# Patient Record
Sex: Male | Born: 1945 | Race: Black or African American | Hispanic: No | Marital: Married | Smoking: Former smoker
Health system: Southern US, Community
[De-identification: ages and names within clinical notes are randomized; demographics above are authoritative.]

## PROBLEM LIST (undated history)

## (undated) DIAGNOSIS — E785 Hyperlipidemia, unspecified: Secondary | ICD-10-CM

## (undated) DIAGNOSIS — I10 Essential (primary) hypertension: Secondary | ICD-10-CM

## (undated) DIAGNOSIS — S52372A Galeazzi's fracture of left radius, initial encounter for closed fracture: Secondary | ICD-10-CM

## (undated) DIAGNOSIS — E119 Type 2 diabetes mellitus without complications: Secondary | ICD-10-CM

## (undated) DIAGNOSIS — D649 Anemia, unspecified: Secondary | ICD-10-CM

## (undated) HISTORY — PX: APPENDECTOMY: SHX54

## (undated) HISTORY — DX: Anemia, unspecified: D64.9

## (undated) HISTORY — PX: HEMICOLECTOMY: SHX854

## (undated) HISTORY — DX: Hyperlipidemia, unspecified: E78.5

## (undated) HISTORY — PX: BACK SURGERY: SHX140

---

## 2000-02-22 ENCOUNTER — Emergency Department (HOSPITAL_COMMUNITY): Admission: EM | Admit: 2000-02-22 | Discharge: 2000-02-22 | Payer: Self-pay | Admitting: Emergency Medicine

## 2000-03-10 ENCOUNTER — Encounter: Payer: Self-pay | Admitting: Orthopedic Surgery

## 2000-03-10 ENCOUNTER — Ambulatory Visit (HOSPITAL_COMMUNITY): Admission: RE | Admit: 2000-03-10 | Discharge: 2000-03-10 | Payer: Self-pay | Admitting: Orthopedic Surgery

## 2000-06-30 ENCOUNTER — Inpatient Hospital Stay (HOSPITAL_COMMUNITY): Admission: RE | Admit: 2000-06-30 | Discharge: 2000-07-02 | Payer: Self-pay | Admitting: Neurosurgery

## 2000-07-28 ENCOUNTER — Emergency Department (HOSPITAL_COMMUNITY): Admission: EM | Admit: 2000-07-28 | Discharge: 2000-07-28 | Payer: Self-pay | Admitting: Emergency Medicine

## 2000-08-04 ENCOUNTER — Encounter: Admission: RE | Admit: 2000-08-04 | Discharge: 2000-08-04 | Payer: Self-pay | Admitting: Neurosurgery

## 2000-09-06 ENCOUNTER — Encounter: Admission: RE | Admit: 2000-09-06 | Discharge: 2000-09-21 | Payer: Self-pay | Admitting: Neurosurgery

## 2002-07-23 ENCOUNTER — Inpatient Hospital Stay (HOSPITAL_COMMUNITY): Admission: EM | Admit: 2002-07-23 | Discharge: 2002-07-31 | Payer: Self-pay | Admitting: Family Medicine

## 2002-07-23 ENCOUNTER — Encounter: Payer: Self-pay | Admitting: Family Medicine

## 2002-07-23 ENCOUNTER — Encounter: Payer: Self-pay | Admitting: General Surgery

## 2005-01-26 ENCOUNTER — Ambulatory Visit: Payer: Self-pay | Admitting: Internal Medicine

## 2005-02-05 ENCOUNTER — Ambulatory Visit: Payer: Self-pay | Admitting: Internal Medicine

## 2010-08-14 ENCOUNTER — Emergency Department (HOSPITAL_COMMUNITY)
Admission: EM | Admit: 2010-08-14 | Discharge: 2010-08-14 | Disposition: A | Discharge status: Home / Self Care | Payer: Medicare HMO | Attending: Emergency Medicine | Admitting: Emergency Medicine

## 2010-08-14 DIAGNOSIS — M25539 Pain in unspecified wrist: Secondary | ICD-10-CM | POA: Insufficient documentation

## 2010-08-14 DIAGNOSIS — E7800 Pure hypercholesterolemia, unspecified: Secondary | ICD-10-CM | POA: Insufficient documentation

## 2010-08-14 DIAGNOSIS — I10 Essential (primary) hypertension: Secondary | ICD-10-CM | POA: Insufficient documentation

## 2010-08-14 DIAGNOSIS — M7989 Other specified soft tissue disorders: Secondary | ICD-10-CM | POA: Insufficient documentation

## 2014-03-24 DIAGNOSIS — K660 Peritoneal adhesions (postprocedural) (postinfection): Secondary | ICD-10-CM | POA: Insufficient documentation

## 2014-03-24 DIAGNOSIS — I10 Essential (primary) hypertension: Secondary | ICD-10-CM | POA: Insufficient documentation

## 2014-03-24 DIAGNOSIS — R001 Bradycardia, unspecified: Secondary | ICD-10-CM | POA: Insufficient documentation

## 2014-03-24 DIAGNOSIS — I2510 Atherosclerotic heart disease of native coronary artery without angina pectoris: Secondary | ICD-10-CM | POA: Insufficient documentation

## 2014-03-24 DIAGNOSIS — E119 Type 2 diabetes mellitus without complications: Secondary | ICD-10-CM | POA: Diagnosis not present

## 2014-03-24 DIAGNOSIS — I517 Cardiomegaly: Secondary | ICD-10-CM | POA: Insufficient documentation

## 2014-03-24 DIAGNOSIS — K8044 Calculus of bile duct with chronic cholecystitis without obstruction: Secondary | ICD-10-CM | POA: Diagnosis not present

## 2014-03-24 DIAGNOSIS — K760 Fatty (change of) liver, not elsewhere classified: Secondary | ICD-10-CM | POA: Diagnosis not present

## 2014-03-24 DIAGNOSIS — K5730 Diverticulosis of large intestine without perforation or abscess without bleeding: Secondary | ICD-10-CM | POA: Insufficient documentation

## 2014-03-24 DIAGNOSIS — Z87891 Personal history of nicotine dependence: Secondary | ICD-10-CM | POA: Diagnosis not present

## 2014-03-24 DIAGNOSIS — N281 Cyst of kidney, acquired: Secondary | ICD-10-CM | POA: Diagnosis not present

## 2014-03-24 DIAGNOSIS — R1013 Epigastric pain: Secondary | ICD-10-CM | POA: Diagnosis present

## 2014-03-25 ENCOUNTER — Emergency Department (HOSPITAL_COMMUNITY): Payer: Medicare HMO

## 2014-03-25 ENCOUNTER — Observation Stay (HOSPITAL_COMMUNITY)
Admission: EM | Admit: 2014-03-25 | Discharge: 2014-03-26 | Disposition: A | Discharge status: Home / Self Care | Payer: Medicare HMO | Attending: General Surgery | Admitting: General Surgery

## 2014-03-25 ENCOUNTER — Encounter (HOSPITAL_COMMUNITY): Payer: Self-pay | Admitting: *Deleted

## 2014-03-25 ENCOUNTER — Encounter (HOSPITAL_COMMUNITY)
Admission: EM | Disposition: A | Discharge status: Home / Self Care | Payer: Self-pay | Source: Home / Self Care | Attending: Emergency Medicine

## 2014-03-25 ENCOUNTER — Observation Stay (HOSPITAL_COMMUNITY): Payer: Medicare HMO | Admitting: Anesthesiology

## 2014-03-25 ENCOUNTER — Observation Stay (HOSPITAL_COMMUNITY): Payer: Medicare HMO

## 2014-03-25 DIAGNOSIS — R52 Pain, unspecified: Secondary | ICD-10-CM

## 2014-03-25 DIAGNOSIS — K8044 Calculus of bile duct with chronic cholecystitis without obstruction: Secondary | ICD-10-CM | POA: Diagnosis not present

## 2014-03-25 DIAGNOSIS — K819 Cholecystitis, unspecified: Secondary | ICD-10-CM | POA: Diagnosis present

## 2014-03-25 DIAGNOSIS — K760 Fatty (change of) liver, not elsewhere classified: Secondary | ICD-10-CM | POA: Diagnosis not present

## 2014-03-25 DIAGNOSIS — I10 Essential (primary) hypertension: Secondary | ICD-10-CM | POA: Diagnosis not present

## 2014-03-25 DIAGNOSIS — K8050 Calculus of bile duct without cholangitis or cholecystitis without obstruction: Secondary | ICD-10-CM | POA: Diagnosis present

## 2014-03-25 DIAGNOSIS — K660 Peritoneal adhesions (postprocedural) (postinfection): Secondary | ICD-10-CM | POA: Diagnosis not present

## 2014-03-25 DIAGNOSIS — K8001 Calculus of gallbladder with acute cholecystitis with obstruction: Secondary | ICD-10-CM | POA: Diagnosis present

## 2014-03-25 DIAGNOSIS — K8010 Calculus of gallbladder with chronic cholecystitis without obstruction: Secondary | ICD-10-CM

## 2014-03-25 DIAGNOSIS — R071 Chest pain on breathing: Secondary | ICD-10-CM

## 2014-03-25 DIAGNOSIS — R109 Unspecified abdominal pain: Secondary | ICD-10-CM

## 2014-03-25 HISTORY — DX: Type 2 diabetes mellitus without complications: E11.9

## 2014-03-25 HISTORY — PX: LYSIS OF ADHESION: SHX5961

## 2014-03-25 HISTORY — PX: CHOLECYSTECTOMY: SHX55

## 2014-03-25 HISTORY — DX: Essential (primary) hypertension: I10

## 2014-03-25 LAB — CBC WITH DIFFERENTIAL/PLATELET
BASOS PCT: 0 % (ref 0–1)
Basophils Absolute: 0 10*3/uL (ref 0.0–0.1)
EOS ABS: 0.1 10*3/uL (ref 0.0–0.7)
Eosinophils Relative: 2 % (ref 0–5)
HEMATOCRIT: 40.6 % (ref 39.0–52.0)
HEMOGLOBIN: 13.2 g/dL (ref 13.0–17.0)
Lymphocytes Relative: 22 % (ref 12–46)
Lymphs Abs: 1.8 10*3/uL (ref 0.7–4.0)
MCH: 27.2 pg (ref 26.0–34.0)
MCHC: 32.5 g/dL (ref 30.0–36.0)
MCV: 83.5 fL (ref 78.0–100.0)
MONO ABS: 0.6 10*3/uL (ref 0.1–1.0)
Monocytes Relative: 8 % (ref 3–12)
Neutro Abs: 5.5 10*3/uL (ref 1.7–7.7)
Neutrophils Relative %: 68 % (ref 43–77)
Platelets: 171 10*3/uL (ref 150–400)
RBC: 4.86 MIL/uL (ref 4.22–5.81)
RDW: 13.7 % (ref 11.5–15.5)
WBC: 8 10*3/uL (ref 4.0–10.5)

## 2014-03-25 LAB — COMPREHENSIVE METABOLIC PANEL
ALT: 29 U/L (ref 0–53)
ANION GAP: 8 (ref 5–15)
AST: 59 U/L — ABNORMAL HIGH (ref 0–37)
Albumin: 4.1 g/dL (ref 3.5–5.2)
Alkaline Phosphatase: 73 U/L (ref 39–117)
BUN: 12 mg/dL (ref 6–23)
CALCIUM: 9.8 mg/dL (ref 8.4–10.5)
CO2: 26 mmol/L (ref 19–32)
Chloride: 106 mEq/L (ref 96–112)
Creatinine, Ser: 1.18 mg/dL (ref 0.50–1.35)
GFR calc Af Amer: 70 mL/min — ABNORMAL LOW (ref >90)
GFR calc non Af Amer: 61 mL/min — ABNORMAL LOW (ref >90)
Glucose, Bld: 152 mg/dL — ABNORMAL HIGH (ref 70–99)
Potassium: 3.6 mmol/L (ref 3.5–5.1)
Sodium: 140 mmol/L (ref 135–145)
TOTAL PROTEIN: 7.7 g/dL (ref 6.0–8.3)
Total Bilirubin: 1.4 mg/dL — ABNORMAL HIGH (ref 0.3–1.2)

## 2014-03-25 LAB — URINALYSIS, ROUTINE W REFLEX MICROSCOPIC
BILIRUBIN URINE: NEGATIVE
Glucose, UA: 100 mg/dL — AB
Hgb urine dipstick: NEGATIVE
Ketones, ur: NEGATIVE mg/dL
Leukocytes, UA: NEGATIVE
NITRITE: NEGATIVE
Protein, ur: NEGATIVE mg/dL
Specific Gravity, Urine: 1.017 (ref 1.005–1.030)
UROBILINOGEN UA: 1 mg/dL (ref 0.0–1.0)
pH: 7.5 (ref 5.0–8.0)

## 2014-03-25 LAB — SURGICAL PCR SCREEN
MRSA, PCR: NEGATIVE
Staphylococcus aureus: NEGATIVE

## 2014-03-25 LAB — GLUCOSE, CAPILLARY
GLUCOSE-CAPILLARY: 110 mg/dL — AB (ref 70–99)
Glucose-Capillary: 69 mg/dL — ABNORMAL LOW (ref 70–99)

## 2014-03-25 LAB — LIPASE, BLOOD: LIPASE: 41 U/L (ref 11–59)

## 2014-03-25 LAB — CG4 I-STAT (LACTIC ACID): Lactic Acid, Venous: 1.87 mmol/L (ref 0.5–2.2)

## 2014-03-25 LAB — TROPONIN I

## 2014-03-25 SURGERY — LAPAROSCOPIC CHOLECYSTECTOMY WITH INTRAOPERATIVE CHOLANGIOGRAM
Anesthesia: General | Site: Abdomen

## 2014-03-25 MED ORDER — FENTANYL CITRATE 0.05 MG/ML IJ SOLN
INTRAMUSCULAR | Status: DC | PRN
  Administered 2014-03-25: 150 ug via INTRAVENOUS
  Administered 2014-03-25: 100 ug via INTRAVENOUS

## 2014-03-25 MED ORDER — LACTATED RINGERS IV SOLN
INTRAVENOUS | Status: DC | PRN
  Administered 2014-03-25 (×2): via INTRAVENOUS

## 2014-03-25 MED ORDER — GLYCOPYRROLATE 0.2 MG/ML IJ SOLN
INTRAMUSCULAR | Status: AC
  Filled 2014-03-25: qty 4

## 2014-03-25 MED ORDER — HYDROMORPHONE HCL 1 MG/ML IJ SOLN
1.0000 mg | Freq: Once | INTRAMUSCULAR | Status: AC
  Administered 2014-03-25: 1 mg via INTRAVENOUS
  Filled 2014-03-25: qty 1

## 2014-03-25 MED ORDER — LIDOCAINE HCL (CARDIAC) 20 MG/ML IV SOLN
INTRAVENOUS | Status: AC
  Filled 2014-03-25: qty 5

## 2014-03-25 MED ORDER — OXYCODONE HCL 5 MG PO TABS
5.0000 mg | ORAL_TABLET | ORAL | Status: DC | PRN
  Administered 2014-03-25 – 2014-03-26 (×3): 10 mg via ORAL
  Filled 2014-03-25 (×3): qty 2

## 2014-03-25 MED ORDER — INFLUENZA VAC SPLIT QUAD 0.5 ML IM SUSY
0.5000 mL | PREFILLED_SYRINGE | INTRAMUSCULAR | Status: AC
  Administered 2014-03-26: 0.5 mL via INTRAMUSCULAR
  Filled 2014-03-25: qty 0.5

## 2014-03-25 MED ORDER — CEFAZOLIN SODIUM-DEXTROSE 2-3 GM-% IV SOLR
INTRAVENOUS | Status: DC | PRN
  Administered 2014-03-25: 2 g via INTRAVENOUS

## 2014-03-25 MED ORDER — AMLODIPINE BESYLATE 10 MG PO TABS
10.0000 mg | ORAL_TABLET | Freq: Every day | ORAL | Status: DC
  Administered 2014-03-25 – 2014-03-26 (×2): 10 mg via ORAL
  Filled 2014-03-25 (×3): qty 1

## 2014-03-25 MED ORDER — PROMETHAZINE HCL 25 MG/ML IJ SOLN: 6.2500 mg | INTRAMUSCULAR | Status: DC | PRN

## 2014-03-25 MED ORDER — ONDANSETRON HCL 4 MG/2ML IJ SOLN
INTRAMUSCULAR | Status: AC
  Filled 2014-03-25: qty 2

## 2014-03-25 MED ORDER — SUCCINYLCHOLINE CHLORIDE 20 MG/ML IJ SOLN
INTRAMUSCULAR | Status: DC | PRN
  Administered 2014-03-25: 80 mg via INTRAVENOUS

## 2014-03-25 MED ORDER — BUPIVACAINE-EPINEPHRINE 0.25% -1:200000 IJ SOLN
INTRAMUSCULAR | Status: DC | PRN
  Administered 2014-03-25: 8 mL

## 2014-03-25 MED ORDER — NEOSTIGMINE METHYLSULFATE 10 MG/10ML IV SOLN
INTRAVENOUS | Status: DC | PRN
  Administered 2014-03-25: 4 mg via INTRAVENOUS

## 2014-03-25 MED ORDER — ROCURONIUM BROMIDE 100 MG/10ML IV SOLN
INTRAVENOUS | Status: DC | PRN
  Administered 2014-03-25: 30 mg via INTRAVENOUS

## 2014-03-25 MED ORDER — IOHEXOL 300 MG/ML  SOLN
100.0000 mL | Freq: Once | INTRAMUSCULAR | Status: AC | PRN
  Administered 2014-03-25: 100 mL via INTRAVENOUS

## 2014-03-25 MED ORDER — 0.9 % SODIUM CHLORIDE (POUR BTL) OPTIME
TOPICAL | Status: DC | PRN
  Administered 2014-03-25: 1000 mL

## 2014-03-25 MED ORDER — GLYCOPYRROLATE 0.2 MG/ML IJ SOLN
INTRAMUSCULAR | Status: DC | PRN
  Administered 2014-03-25: 0.2 mg via INTRAVENOUS
  Administered 2014-03-25: 0.6 mg via INTRAVENOUS

## 2014-03-25 MED ORDER — ONDANSETRON HCL 4 MG/2ML IJ SOLN: 4.0000 mg | Freq: Four times a day (QID) | INTRAMUSCULAR | Status: DC | PRN

## 2014-03-25 MED ORDER — ROCURONIUM BROMIDE 50 MG/5ML IV SOLN
INTRAVENOUS | Status: AC
  Filled 2014-03-25: qty 1

## 2014-03-25 MED ORDER — ONDANSETRON HCL 4 MG/2ML IJ SOLN
4.0000 mg | Freq: Once | INTRAMUSCULAR | Status: AC
Start: 2014-03-25 — End: 2014-03-25
  Administered 2014-03-25: 4 mg via INTRAVENOUS
  Filled 2014-03-25: qty 2

## 2014-03-25 MED ORDER — LACTATED RINGERS IV SOLN
INTRAVENOUS | Status: DC
  Administered 2014-03-25: 13:00:00 via INTRAVENOUS

## 2014-03-25 MED ORDER — MEPERIDINE HCL 25 MG/ML IJ SOLN: 6.2500 mg | INTRAMUSCULAR | Status: DC | PRN

## 2014-03-25 MED ORDER — FENTANYL CITRATE 0.05 MG/ML IJ SOLN
INTRAMUSCULAR | Status: AC
  Filled 2014-03-25: qty 2

## 2014-03-25 MED ORDER — EPHEDRINE SULFATE 50 MG/ML IJ SOLN
INTRAMUSCULAR | Status: AC
  Filled 2014-03-25: qty 1

## 2014-03-25 MED ORDER — ONDANSETRON HCL 4 MG/2ML IJ SOLN
INTRAMUSCULAR | Status: DC | PRN
  Administered 2014-03-25: 4 mg via INTRAVENOUS

## 2014-03-25 MED ORDER — PIPERACILLIN-TAZOBACTAM 3.375 G IVPB
3.3750 g | Freq: Three times a day (TID) | INTRAVENOUS | Status: DC
  Filled 2014-03-25 (×2): qty 50

## 2014-03-25 MED ORDER — MORPHINE SULFATE 2 MG/ML IJ SOLN: 1.0000 mg | INTRAMUSCULAR | Status: DC | PRN

## 2014-03-25 MED ORDER — SODIUM CHLORIDE 0.9 % IV SOLN
INTRAVENOUS | Status: DC | PRN
  Administered 2014-03-25: 10 mL

## 2014-03-25 MED ORDER — LIDOCAINE HCL (CARDIAC) 20 MG/ML IV SOLN
INTRAVENOUS | Status: DC | PRN
  Administered 2014-03-25: 60 mg via INTRAVENOUS

## 2014-03-25 MED ORDER — BUPIVACAINE-EPINEPHRINE (PF) 0.25% -1:200000 IJ SOLN
INTRAMUSCULAR | Status: AC
  Filled 2014-03-25: qty 30

## 2014-03-25 MED ORDER — EPHEDRINE SULFATE 50 MG/ML IJ SOLN
INTRAMUSCULAR | Status: DC | PRN
  Administered 2014-03-25: 15 mg via INTRAVENOUS
  Administered 2014-03-25: 10 mg via INTRAVENOUS

## 2014-03-25 MED ORDER — FENTANYL CITRATE 0.05 MG/ML IJ SOLN
25.0000 ug | INTRAMUSCULAR | Status: DC | PRN
  Administered 2014-03-25: 50 ug via INTRAVENOUS

## 2014-03-25 MED ORDER — FENTANYL CITRATE 0.05 MG/ML IJ SOLN
INTRAMUSCULAR | Status: AC
  Filled 2014-03-25: qty 5

## 2014-03-25 MED ORDER — SODIUM CHLORIDE 0.9 % IR SOLN
Status: DC | PRN
  Administered 2014-03-25: 1000 mL

## 2014-03-25 MED ORDER — PHENYLEPHRINE 40 MCG/ML (10ML) SYRINGE FOR IV PUSH (FOR BLOOD PRESSURE SUPPORT)
PREFILLED_SYRINGE | INTRAVENOUS | Status: AC
  Filled 2014-03-25: qty 10

## 2014-03-25 MED ORDER — PROPOFOL 10 MG/ML IV BOLUS
INTRAVENOUS | Status: DC | PRN
  Administered 2014-03-25: 160 mg via INTRAVENOUS

## 2014-03-25 MED ORDER — STERILE WATER FOR INJECTION IJ SOLN
INTRAMUSCULAR | Status: AC
  Filled 2014-03-25: qty 10

## 2014-03-25 MED ORDER — SUCCINYLCHOLINE CHLORIDE 20 MG/ML IJ SOLN
INTRAMUSCULAR | Status: AC
  Filled 2014-03-25: qty 1

## 2014-03-25 MED ORDER — PROPOFOL 10 MG/ML IV BOLUS
INTRAVENOUS | Status: AC
  Filled 2014-03-25: qty 20

## 2014-03-25 MED ORDER — SODIUM CHLORIDE 0.9 % IV SOLN
Freq: Once | INTRAVENOUS | Status: AC
  Administered 2014-03-25: 01:00:00 via INTRAVENOUS

## 2014-03-25 MED ORDER — POTASSIUM CHLORIDE IN NACL 20-0.9 MEQ/L-% IV SOLN
INTRAVENOUS | Status: DC
  Administered 2014-03-25 (×2): via INTRAVENOUS
  Filled 2014-03-25 (×3): qty 1000

## 2014-03-25 SURGICAL SUPPLY — 50 items
APPLIER CLIP 5 13 M/L LIGAMAX5 (MISCELLANEOUS) ×3
APR CLP MED LRG 5 ANG JAW (MISCELLANEOUS) ×2
BAG SPEC RTRVL 10 TROC 200 (ENDOMECHANICALS) ×2
BLADE SURG ROTATE 9660 (MISCELLANEOUS) IMPLANT
CANISTER SUCTION 2500CC (MISCELLANEOUS) ×3 IMPLANT
CHLORAPREP W/TINT 26ML (MISCELLANEOUS) ×3 IMPLANT
CLIP APPLIE 5 13 M/L LIGAMAX5 (MISCELLANEOUS) ×2 IMPLANT
COVER MAYO STAND STRL (DRAPES) ×3 IMPLANT
COVER SURGICAL LIGHT HANDLE (MISCELLANEOUS) ×3 IMPLANT
DRAPE C-ARM 42X72 X-RAY (DRAPES) ×3 IMPLANT
DRAPE LAPAROSCOPIC ABDOMINAL (DRAPES) ×3 IMPLANT
ELECT REM PT RETURN 9FT ADLT (ELECTROSURGICAL) ×3
ELECTRODE REM PT RTRN 9FT ADLT (ELECTROSURGICAL) ×2 IMPLANT
FILTER SMOKE EVAC LAPAROSHD (FILTER) ×1 IMPLANT
GLOVE BIO SURGEON STRL SZ7.5 (GLOVE) ×2 IMPLANT
GLOVE BIO SURGEON STRL SZ8 (GLOVE) ×3 IMPLANT
GLOVE BIOGEL PI IND STRL 6.5 (GLOVE) IMPLANT
GLOVE BIOGEL PI IND STRL 7.0 (GLOVE) IMPLANT
GLOVE BIOGEL PI IND STRL 7.5 (GLOVE) IMPLANT
GLOVE BIOGEL PI IND STRL 8 (GLOVE) ×2 IMPLANT
GLOVE BIOGEL PI INDICATOR 6.5 (GLOVE) ×2
GLOVE BIOGEL PI INDICATOR 7.0 (GLOVE) ×1
GLOVE BIOGEL PI INDICATOR 7.5 (GLOVE) ×2
GLOVE BIOGEL PI INDICATOR 8 (GLOVE) ×1
GLOVE SKINSENSE NS SZ7.0 (GLOVE) ×2
GLOVE SKINSENSE STRL SZ7.0 (GLOVE) IMPLANT
GOWN STRL REUS W/ TWL LRG LVL3 (GOWN DISPOSABLE) ×4 IMPLANT
GOWN STRL REUS W/ TWL XL LVL3 (GOWN DISPOSABLE) ×2 IMPLANT
GOWN STRL REUS W/TWL LRG LVL3 (GOWN DISPOSABLE) ×9
GOWN STRL REUS W/TWL XL LVL3 (GOWN DISPOSABLE) ×6
KIT BASIN OR (CUSTOM PROCEDURE TRAY) ×3 IMPLANT
KIT ROOM TURNOVER OR (KITS) ×3 IMPLANT
LIQUID BAND (GAUZE/BANDAGES/DRESSINGS) ×3 IMPLANT
NS IRRIG 1000ML POUR BTL (IV SOLUTION) ×3 IMPLANT
PAD ARMBOARD 7.5X6 YLW CONV (MISCELLANEOUS) ×3 IMPLANT
POUCH RETRIEVAL ECOSAC 10 (ENDOMECHANICALS) ×2 IMPLANT
POUCH RETRIEVAL ECOSAC 10MM (ENDOMECHANICALS) ×1
SCISSORS LAP 5X35 DISP (ENDOMECHANICALS) ×3 IMPLANT
SET CHOLANGIOGRAPH 5 50 .035 (SET/KITS/TRAYS/PACK) ×3 IMPLANT
SET IRRIG TUBING LAPAROSCOPIC (IRRIGATION / IRRIGATOR) ×3 IMPLANT
SLEEVE ENDOPATH XCEL 5M (ENDOMECHANICALS) ×6 IMPLANT
SPECIMEN JAR SMALL (MISCELLANEOUS) ×3 IMPLANT
SUT VIC AB 4-0 PS2 27 (SUTURE) ×3 IMPLANT
TOWEL OR 17X24 6PK STRL BLUE (TOWEL DISPOSABLE) ×3 IMPLANT
TOWEL OR 17X26 10 PK STRL BLUE (TOWEL DISPOSABLE) ×3 IMPLANT
TRAY LAPAROSCOPIC (CUSTOM PROCEDURE TRAY) ×3 IMPLANT
TROCAR XCEL 12X100 BLDLESS (ENDOMECHANICALS) ×1 IMPLANT
TROCAR XCEL BLUNT TIP 100MML (ENDOMECHANICALS) ×3 IMPLANT
TROCAR XCEL NON-BLD 5MMX100MML (ENDOMECHANICALS) ×3 IMPLANT
TUBING INSUFFLATION (TUBING) ×3 IMPLANT

## 2014-03-25 NOTE — ED Notes (Signed)
Pt taken to US

## 2014-03-25 NOTE — ED Notes (Signed)
Informed pt that we need a urine sample. Left pt urinal to provide sample.

## 2014-03-25 NOTE — Op Note (Signed)
03/25/2014  3:34 PM  PATIENT:  Cameron Harrison  68 y.o. male  PRE-OPERATIVE DIAGNOSIS:  Cholecystitis  POST-OPERATIVE DIAGNOSIS:  Cholecystitis, choledocholithiasis  PROCEDURE:  Procedure(s): Laparoscopic lysis of adhesions for 15 minutes Laparoscopic cholecystectomy with intraoperative cholangiogram   SURGEON:  Surgeon(s): Violeta GelinasBurke Thayden Lemire, MD  ASSISTANTS: Magnus IvanSheila Bowman, RNFA   ANESTHESIA:   local and general  EBL:  Total I/O In: 1000 [I.V.:1000] Out: -   BLOOD ADMINISTERED:none  DRAINS: none   SPECIMEN:  Excision  DISPOSITION OF SPECIMEN:  PATHOLOGY  COUNTS:  YES  DICTATION: .Dragon Dictation patient presents for cholecystectomy and cholangiogram. He was identified in the preop holding area. Informed consent was obtained. He received intravenous antibiotics. He was brought to the operating room and general endotracheal anesthesia was administered by the anesthesia staff. His abdomen was prepped and draped in sterile fashion. We did a time out procedure. Due to his previous midline incision, I chose to do a right upper quadrant Optiview entry. He was positioned appropriately and the area along the right costal margin was injected with local. Small incision was made and 5 mm Optiview was done without any difficulties. Abdomen insufflated and scope was inserted. Close inspection revealed no complications. Next, a second 5 mm right upper quadrant port was placed under direct vision. There were a lot of omental adhesions along the midline, but not involving small bowel above the umbilicus. These were carefully taken down with sharp and blunt dissection to allow placement of our 11 mm port in the supraumbilical region. Excellent hemostasis was obtained. 15 minutes of adhesiolysis. Next, area above the umbilicus was injected with local, small incision was made, 11 mm port was inserted under direct vision. We placed the camera via this new port and visualized the falciform being quite  lateral. A fourth port was placed lateral to the falciform in the right upper quadrant using similar technique. Dome the gallbladder was retracted superior medially. The infundibulum was covered with some mild adhesions which were swept away. The infundibulum was retracted inferior laterally. Dissection began laterally and progressed medially. First, we encountered a cystic artery which entered distally on the infundibulum. This was clipped 3 times proximally once distally and divided. Further dissection achieved a critical view around the cystic duct. Once we had this excellent visualization, a clip was placed on the infundibular cystic duct junction and a small nick was made in the cystic duct. I milked out one small stone from the cystic duct. Cook cholangiogram catheter was then inserted. Intraoperative cholangiogram revealed dilated common bile duct and intrahepatic biliary radicles with a distal common bile duct stone. I was able to flush this through down into the duodenum during the cholangiogram. Subsequent cholangiogram was clear with good flow of contrast into the duodenum. Next, the cholangiogram catheter was removed and I allowed the contrast media to evacuate the dilated biliary system. 3 clips were placed proximally on the cystic duct and it was divided. Gallbladder was taken off the liver bed with Bovie cautery. Excellent hemostasis was obtained. Gallbladder was placed in an Eco sac and removed from the abdomen via the midline port. Abdomen was copiously irrigated. Meticulous hemostasis was ensured and the liver bed. Right upper quadrant was again inspected and no common K features were seen. Clips remain in good position. Irrigation fluid evacuated clear. We reinspected the adhesiolysis area was hemostatic. Ports were removed under direct vision after further 4 quadrant inspection as able. Pneumoperitoneum was released. The fascia of the midline port site was closed  with a figure-of-eight 0 Vicryl.  Wounds were irrigated and closed with running 4 Vicryl subcuticular followed by Dermabond. All counts were correct. Patient tolerated procedure well without apparent complication and was taken recovery in stable condition.  PATIENT DISPOSITION:  PACU - hemodynamically stable.   Delay start of Pharmacological VTE agent (>24hrs) due to surgical blood loss or risk of bleeding:  no  Violeta GelinasBurke Adena Sima, MD, MPH, FACS Pager: [phone]  12/28/20153:34 PM

## 2014-03-25 NOTE — Progress Notes (Signed)
Patient seen and examined. I reviewed his radiographic and laboratory studies. I agree with Dr. Eliberto IvoryBlackman's assessment and plan. Currently tenderness is decreased in the right upper quadrant. Proceed with laparoscopic cholecystectomy and possible cholangiogram. Procedure, risks, and benefits were discussed in detail with the patient. I answered his questions and he is agreeable.

## 2014-03-25 NOTE — ED Notes (Signed)
Pt c/o severe abd pain onset approx 8:30pm tonight.  Pt has vomited several times

## 2014-03-25 NOTE — Anesthesia Postprocedure Evaluation (Signed)
  Anesthesia Post-op Note  Patient: Cameron Harrison  Procedure(s) Performed: Procedure(s): LAPAROSCOPIC CHOLECYSTECTOMY WITH INTRAOPERATIVE CHOLANGIOGRAM (N/A) Laparascopic LYSIS OF ADHESIONs   Patient Location: PACU  Anesthesia Type:General  Level of Consciousness: awake, alert , oriented and patient cooperative  Airway and Oxygen Therapy: Patient Spontanous Breathing  Post-op Pain: mild  Post-op Assessment: Post-op Vital signs reviewed, Patient's Cardiovascular Status Stable, Respiratory Function Stable, Patent Airway, No signs of Nausea or vomiting and Pain level controlled  Post-op Vital Signs: stable  Last Vitals:  Filed Vitals:   03/25/14 1615  BP: 145/78  Pulse: 70  Temp:   Resp: 22    Complications: No apparent anesthesia complications

## 2014-03-25 NOTE — Anesthesia Preprocedure Evaluation (Addendum)
Anesthesia Evaluation  Patient identified by MRN, date of birth, ID band Patient awake    Reviewed: Allergy & Precautions, H&P , NPO status , Patient's Chart, lab work & pertinent test results, reviewed documented beta blocker date and time   Airway Mallampati: II  TM Distance: >3 FB     Dental  (+) Teeth Intact, Dental Advidsory Given, Chipped   Pulmonary former smoker,          Cardiovascular hypertension, Pt. on medications  EKG LVH   Neuro/Psych    GI/Hepatic LFT elevated some   Endo/Other  diabetes, Type 2  Renal/GU GFR 70     Musculoskeletal   Abdominal   Peds  Hematology negative hematology ROS (+)   Anesthesia Other Findings Chipped left front tooth  Reproductive/Obstetrics                           Anesthesia Physical Anesthesia Plan  ASA: III  Anesthesia Plan: General   Post-op Pain Management:    Induction:   Airway Management Planned: Oral ETT  Additional Equipment:   Intra-op Plan:   Post-operative Plan: Extubation in OR  Informed Consent: I have reviewed the patients History and Physical, chart, labs and discussed the procedure including the risks, benefits and alternatives for the proposed anesthesia with the patient or authorized representative who has indicated his/her understanding and acceptance.   Dental Advisory Given  Plan Discussed with: CRNA, Surgeon and Anesthesiologist  Anesthesia Plan Comments:        Anesthesia Quick Evaluation

## 2014-03-25 NOTE — ED Notes (Signed)
The pt has had abd pain since 2030.  Unable to get a history from the pt a family member is answering for him.  Writhing in the chair as the family member is texting on his cell

## 2014-03-25 NOTE — Progress Notes (Signed)
ANTIBIOTIC CONSULT NOTE - INITIAL  Pharmacy Consult for zosyn Indication: cholecystitis  No Known Allergies  Patient Measurements: Height: 6\' 2"  (188 cm) Weight: 228 lb (103.42 kg) IBW/kg (Calculated) : 82.2   Vital Signs: Temp: 98 F (36.7 C) (12/28 0900) Temp Source: Oral (12/28 0900) BP: 130/85 mmHg (12/28 0900) Pulse Rate: 116 (12/28 0900) Intake/Output from previous day: 12/27 0701 - 12/28 0700 In: 1000 [I.V.:1000] Out: -  Intake/Output from this shift:    Labs:  Recent Labs  03/25/14 0024  WBC 8.0  HGB 13.2  PLT 171  CREATININE 1.18   Estimated Creatinine Clearance: 74.7 mL/min (by C-G formula based on Cr of 1.18). No results for input(s): VANCOTROUGH, VANCOPEAK, VANCORANDOM, GENTTROUGH, GENTPEAK, GENTRANDOM, TOBRATROUGH, TOBRAPEAK, TOBRARND, AMIKACINPEAK, AMIKACINTROU, AMIKACIN in the last 72 hours.   Microbiology: No results found for this or any previous visit (from the past 720 hour(s)).  Medical History: Past Medical History  Diagnosis Date  . Hypertension   . Diabetes mellitus without complication     Medications:  Prescriptions prior to admission  Medication Sig Dispense Refill Last Dose  . amLODipine (NORVASC) 10 MG tablet Take 10 mg by mouth daily.   03/24/2014 at Unknown time  . aspirin EC 81 MG tablet Take 81 mg by mouth daily.   03/24/2014 at Unknown time  . metFORMIN (GLUCOPHAGE) 500 MG tablet Take 500 mg by mouth daily with breakfast.   03/24/2014 at Unknown time   Assessment: 68 yo man who presents with abdominal pain to start zosyn for cholecystitis.  CrCl ~75 ml/min  Goal of Therapy:  Eradication of infection  Plan:  Zosyn 3.375 gm IV q8 hours Pharmcy will sign off as renal function is good and no dose adjustment necessary.  Cameron Harrison 03/25/2014,11:22 AM

## 2014-03-25 NOTE — ED Notes (Signed)
Dr Magnus IvanBlackman at bedside with pt.

## 2014-03-25 NOTE — ED Notes (Signed)
Attempted report to floor.  

## 2014-03-25 NOTE — ED Provider Notes (Signed)
CSN: [id number]     Arrival date & time 03/24/14  2349 History  This chart was scribed for Tilden FossaElizabeth Emilija Bohman, MD by Karle PlumberJennifer Tensley, ED Scribe. This patient was seen in room D31C/D31C and the patient's care was started at 12:46 AM.  Chief Complaint  Patient presents with  . Abdominal Pain   Patient is a 68 y.o. male presenting with abdominal pain. The history is provided by the patient. No language interpreter was used.  Abdominal Pain Associated symptoms: vomiting   Associated symptoms: no chest pain, no diarrhea, no dysuria and no fever     HPI Comments:  Cameron Harrison is a 68 y.o. male who presents to the Emergency Department complaining of sudden onset abdominal pain that started approximately 6 hours ago after eating. Pt reports associated severe vomiting. Reports taking Pepto-Bismol with no significant relief of his symptoms. Pain is described as pain in is central abdominal in location. Denies modifying factors. Denies fever, hematemesis, diarrhea, CP or dysuria. He reports allergy to a pain medication but cannot recall the name reporting a reaction of nausea. PMH of HTN and DM. Past abdominal surgery of appendectomy.  Past Medical History  Diagnosis Date  . Hypertension   . Diabetes mellitus without complication    History reviewed. No pertinent past surgical history. No family history on file. History  Substance Use Topics  . Smoking status: Never Smoker   . Smokeless tobacco: Not on file  . Alcohol Use: No    Review of Systems  Constitutional: Negative for fever.  Cardiovascular: Negative for chest pain.  Gastrointestinal: Positive for vomiting and abdominal pain. Negative for diarrhea.  Genitourinary: Negative for dysuria.  All other systems reviewed and are negative.   Allergies  Review of patient's allergies indicates no known allergies.  Home Medications   Prior to Admission medications   Not on File   Triage Vitals: BP 157/88 mmHg  Pulse 69  Temp(Src)  97.5 F (36.4 C) (Oral)  Resp 24  Ht 6\' 2"  (1.88 m)  Wt 228 lb (103.42 kg)  BMI 29.26 kg/m2  SpO2 100% Physical Exam  Constitutional: He is oriented to person, place, and time. He appears well-developed and well-nourished. He appears distressed (moderate).  HENT:  Head: Normocephalic and atraumatic.  Cardiovascular: Normal rate and regular rhythm.   No murmur heard. Pulmonary/Chest: Effort normal and breath sounds normal. No respiratory distress.  Abdominal: Soft. There is tenderness (mild, diffuse). There is no rebound and no guarding.  Decreased bowel sounds.  Musculoskeletal: He exhibits no edema or tenderness.  Neurological: He is alert and oriented to person, place, and time.  Skin: Skin is warm and dry.  Psychiatric: He has a normal mood and affect. His behavior is normal.  Nursing note and vitals reviewed.   ED Course  Procedures (including critical care time) DIAGNOSTIC STUDIES: Oxygen Saturation is 100% on RA, normal by my interpretation.   COORDINATION OF CARE: 12:52 AM- Will order pain medication and labs. Pt verbalizes understanding and agrees to plan.  Medications  HYDROmorphone (DILAUDID) injection 1 mg (not administered)  ondansetron (ZOFRAN) injection 4 mg (not administered)    Labs Review Labs Reviewed  COMPREHENSIVE METABOLIC PANEL - Abnormal; Notable for the following:    Glucose, Bld 152 (*)    AST 59 (*)    Total Bilirubin 1.4 (*)    GFR calc non Af Amer 61 (*)    GFR calc Af Amer 70 (*)    All other components within normal limits  URINALYSIS, ROUTINE W REFLEX MICROSCOPIC - Abnormal; Notable for the following:    APPearance CLOUDY (*)    Glucose, UA 100 (*)    All other components within normal limits  CBC WITH DIFFERENTIAL  LIPASE, BLOOD  TROPONIN I  I-STAT CG4 LACTIC ACID, ED    Imaging Review Dg Chest Port 1 View  03/25/2014   CLINICAL DATA:  Epigastric and chest pain, vomiting.  EXAM: PORTABLE CHEST - 1 VIEW  COMPARISON:  None.   FINDINGS: Heart is borderline in size. Bibasilar opacities likely reflect atelectasis. No effusions. No acute bony abnormality.  IMPRESSION: Bibasilar opacities likely reflect atelectasis.   Electronically Signed   By: Charlett NoseKevin  Dover M.D.   On: 03/25/2014 01:12     EKG Interpretation   Date/Time:  Monday March 25 2014 01:02:50 EST Ventricular Rate:  51 PR Interval:  171 QRS Duration: 97 QT Interval:  426 QTC Calculation: 392 R Axis:   64 Text Interpretation:  Sinus bradycardia Atrial premature complexes Left  ventricular hypertrophy Confirmed by Lincoln Brighamees, Liz [id number]) on 03/25/2014  1:13:00 AM      MDM   Final diagnoses:  Abdominal pain  Cholecystitis    Patient here for evaluation of abdominal pain and vomiting. Initial exam was nonfocal with mild diffuse tenderness. CT scan obtained to evaluate for obstruction. CT scan with some changes of the gallbladder that are concerning for possible cholecystitis. On recheck after the CT scan patient denies any abdominal pain and has a soft nontender exam. Discussed with general surgeon, and recommend ultrasound to further evaluate. Ultrasound was obtained which is concerning for cholecystitis. On repeat abdominal exam patient continues to have soft and nontender abdomen and denies any abdominal pain at this time. Surgery consultation and will see the patient emergency department.   I personally performed the services described in this documentation, which was scribed in my presence. The recorded information has been reviewed and is accurate.    Tilden FossaElizabeth Weston Kallman, MD 03/25/14 [phone]

## 2014-03-25 NOTE — ED Notes (Signed)
Patient transported to CT 

## 2014-03-25 NOTE — Transfer of Care (Signed)
Immediate Anesthesia Transfer of Care Note  Patient: Cameron Harrison  Procedure(s) Performed: Procedure(s): LAPAROSCOPIC CHOLECYSTECTOMY WITH INTRAOPERATIVE CHOLANGIOGRAM (N/A) Laparascopic LYSIS OF ADHESIONs   Patient Location: PACU  Anesthesia Type:General  Level of Consciousness: awake, patient cooperative and responds to stimulation  Airway & Oxygen Therapy: Patient Spontanous Breathing and Patient connected to face mask oxygen  Post-op Assessment: Report given to PACU RN, Post -op Vital signs reviewed and stable and Patient moving all extremities X 4  Post vital signs: Reviewed and stable  Complications: No apparent anesthesia complications

## 2014-03-25 NOTE — H&P (Signed)
Cameron Harrison is an 68 y.o. male.   Chief Complaint: abdominal pain, cholecystitis HPI: This gentleman presents with sudden onset of moderate to severe epigastric and right upper quadrant abdominal pain with nausea and vomiting after a fatty meal. He presented to the emergency department. He was found on CAT scan to have findings consistent with cholecystitis. Ultrasound confirmed this. He also has a dilated common bile duct. He now reports that he is pain-free. He has had a recent similar attack which was not as severe. Bowel movements are normal. He denies chest pain or shortness of breath. He is otherwise without complaints.  Past Medical History  Diagnosis Date  . Hypertension   . Diabetes mellitus without complication     History reviewed. No pertinent past surgical history.  No family history on file. Social History:  reports that he has never smoked. He does not have any smokeless tobacco history on file. He reports that he does not drink alcohol. His drug history is not on file.  Allergies: No Known Allergies   (Not in a hospital admission)  Results for orders placed or performed during the hospital encounter of 03/25/14 (from the past 48 hour(s))  CBC with Differential     Status: None   Collection Time: 03/25/14 12:24 AM  Result Value Ref Range   WBC 8.0 4.0 - 10.5 K/uL   RBC 4.86 4.22 - 5.81 MIL/uL   Hemoglobin 13.2 13.0 - 17.0 g/dL   HCT 40.6 39.0 - 52.0 %   MCV 83.5 78.0 - 100.0 fL   MCH 27.2 26.0 - 34.0 pg   MCHC 32.5 30.0 - 36.0 g/dL   RDW 13.7 11.5 - 15.5 %   Platelets 171 150 - 400 K/uL   Neutrophils Relative % 68 43 - 77 %   Neutro Abs 5.5 1.7 - 7.7 K/uL   Lymphocytes Relative 22 12 - 46 %   Lymphs Abs 1.8 0.7 - 4.0 K/uL   Monocytes Relative 8 3 - 12 %   Monocytes Absolute 0.6 0.1 - 1.0 K/uL   Eosinophils Relative 2 0 - 5 %   Eosinophils Absolute 0.1 0.0 - 0.7 K/uL   Basophils Relative 0 0 - 1 %   Basophils Absolute 0.0 0.0 - 0.1 K/uL  Comprehensive  metabolic panel     Status: Abnormal   Collection Time: 03/25/14 12:24 AM  Result Value Ref Range   Sodium 140 135 - 145 mmol/L    Comment: Please note change in reference range.   Potassium 3.6 3.5 - 5.1 mmol/L    Comment: Please note change in reference range.   Chloride 106 96 - 112 mEq/L   CO2 26 19 - 32 mmol/L   Glucose, Bld 152 (H) 70 - 99 mg/dL   BUN 12 6 - 23 mg/dL   Creatinine, Ser 1.18 0.50 - 1.35 mg/dL   Calcium 9.8 8.4 - 10.5 mg/dL   Total Protein 7.7 6.0 - 8.3 g/dL   Albumin 4.1 3.5 - 5.2 g/dL   AST 59 (H) 0 - 37 U/L   ALT 29 0 - 53 U/L   Alkaline Phosphatase 73 39 - 117 U/L   Total Bilirubin 1.4 (H) 0.3 - 1.2 mg/dL   GFR calc non Af Amer 61 (L) >90 mL/min   GFR calc Af Amer 70 (L) >90 mL/min    Comment: (NOTE) The eGFR has been calculated using the CKD EPI equation. This calculation has not been validated in all clinical situations. eGFR's persistently <  90 mL/min signify possible Chronic Kidney Disease.    Anion gap 8 5 - 15  Lipase, blood     Status: None   Collection Time: 03/25/14 12:24 AM  Result Value Ref Range   Lipase 41 11 - 59 U/L  Troponin I     Status: None   Collection Time: 03/25/14 12:28 AM  Result Value Ref Range   Troponin I <0.03 <0.031 ng/mL    Comment:        NO INDICATION OF MYOCARDIAL INJURY. Please note change in reference range.   Urinalysis, Routine w reflex microscopic     Status: Abnormal   Collection Time: 03/25/14  3:15 AM  Result Value Ref Range   Color, Urine YELLOW YELLOW   APPearance CLOUDY (A) CLEAR   Specific Gravity, Urine 1.017 1.005 - 1.030   pH 7.5 5.0 - 8.0   Glucose, UA 100 (A) NEGATIVE mg/dL   Hgb urine dipstick NEGATIVE NEGATIVE   Bilirubin Urine NEGATIVE NEGATIVE   Ketones, ur NEGATIVE NEGATIVE mg/dL   Protein, ur NEGATIVE NEGATIVE mg/dL   Urobilinogen, UA 1.0 0.0 - 1.0 mg/dL   Nitrite NEGATIVE NEGATIVE   Leukocytes, UA NEGATIVE NEGATIVE    Comment: MICROSCOPIC NOT DONE ON URINES WITH NEGATIVE PROTEIN,  BLOOD, LEUKOCYTES, NITRITE, OR GLUCOSE <1000 mg/dL.   Ct Abdomen Pelvis W Contrast  03/25/2014   CLINICAL DATA:  Abdominal pain 6 hours after eating  EXAM: CT ABDOMEN AND PELVIS WITH CONTRAST  TECHNIQUE: Multidetector CT imaging of the abdomen and pelvis was performed using the standard protocol following bolus administration of intravenous contrast.  CONTRAST:  18mL OMNIPAQUE IOHEXOL 300 MG/ML  SOLN  COMPARISON:  None currently available.  FINDINGS: BODY WALL: Unremarkable.  LOWER CHEST: Coronary atherosclerosis. There is mild dependent atelectasis. Reticular nodular densities in the inferior lingula favor scarring given the history.  ABDOMEN/PELVIS:  Liver: No focal abnormality.  Biliary: There is mild gallbladder wall thickening or pericholecystic edema. No calcified gallstone. No evidence for choledocholithiasis.  Pancreas: Unremarkable.  Spleen: Unremarkable.  Adrenals: Unremarkable.  Kidneys and ureters: Numerous bilateral renal cysts. No hydronephrosis or ureteral calculus.  Bladder: Unremarkable.  Reproductive: Multi nodular prostate, mildly deforming the bladder base.  Bowel: No bowel obstruction. There are a few scattered colonic diverticula. Ileocolic resection, presumably for appendicitis reported 07/23/2002.  Retroperitoneum: No mass or adenopathy.  Peritoneum: No ascites or pneumoperitoneum.  Vascular: No acute abnormality.  OSSEOUS: Mid and lower lumbar facet arthropathy with bilateral foraminal stenosis. There has been a L5 laminotomy on the right.  IMPRESSION: Mild gallbladder wall thickening or pericholecystic edema. No calcified cholelithiasis, consider follow-up right upper quadrant ultrasound.   Electronically Signed   By: Jorje Guild M.D.   On: 03/25/2014 04:05   Dg Chest Port 1 View  03/25/2014   CLINICAL DATA:  Epigastric and chest pain, vomiting.  EXAM: PORTABLE CHEST - 1 VIEW  COMPARISON:  None.  FINDINGS: Heart is borderline in size. Bibasilar opacities likely reflect  atelectasis. No effusions. No acute bony abnormality.  IMPRESSION: Bibasilar opacities likely reflect atelectasis.   Electronically Signed   By: Rolm Baptise M.D.   On: 03/25/2014 01:12   US Abdomen Limited Ruq  03/25/2014   CLINICAL DATA:  Initial evaluation for mid abdominal pain. Vomiting.  EXAM: US ABDOMEN LIMITED - RIGHT UPPER QUADRANT  COMPARISON:  Prior CT from earlier the same day.  FINDINGS: Gallbladder:  Multiple mobile bile echogenic stones present within the gallbladder lumen . Few were present within the gallbladder  neck. Gallbladder wall was thickened up to 7.8 mm. Small amount of free pericholecystic fluid. No sonographic Murphy sign elicited on exam.  Common bile duct:  Diameter: Dilated up to 10.7 mm.  Liver:  Diffusely increased echogenicity consistent with steatosis. Focal fatty sparing noted adjacent to the gallbladder fossa.  IMPRESSION: 1. Cholelithiasis with gallbladder wall thickening and small amount of free pericholecystic fluid. No sonographic Murphy sign elicited on exam. Clinical correlation for possible acute cholecystitis recommended. 2. Dilated common bile duct up to 11 mm. No obstructive choledocholithiasis identified. 3. Hepatic steatosis.   Electronically Signed   By: Jeannine Boga M.D.   On: 03/25/2014 05:39    Review of Systems  All other systems reviewed and are negative.   Blood pressure 128/73, pulse 73, temperature 97.5 F (36.4 C), temperature source Oral, resp. rate 12, height $RemoveBe'6\' 2"'HCTQnvTfL$  (1.88 m), weight 228 lb (103.42 kg), SpO2 95 %. Physical Exam  Constitutional: He is oriented to person, place, and time. He appears well-developed and well-nourished. No distress.  HENT:  Head: Normocephalic and atraumatic.  Right Ear: External ear normal.  Left Ear: External ear normal.  Nose: Nose normal.  Mouth/Throat: Oropharynx is clear and moist. No oropharyngeal exudate.  Eyes: Pupils are equal, round, and reactive to light. No scleral icterus.  Neck: Normal  range of motion. No tracheal deviation present.  Cardiovascular: Normal rate, regular rhythm, normal heart sounds and intact distal pulses.   No murmur heard. Respiratory: Effort normal and breath sounds normal. No respiratory distress. He has no wheezes. He has no rales.  GI: Soft. He exhibits no distension. There is tenderness. There is guarding.  There is very mild tenderness with guarding in the right upper quadrant  Musculoskeletal: Normal range of motion. He exhibits no edema or tenderness.  Lymphadenopathy:    He has no cervical adenopathy.  Neurological: He is alert and oriented to person, place, and time.  Skin: Skin is warm and dry. He is not diaphoretic. No erythema.  Psychiatric: His behavior is normal.     Assessment/Plan Acute cholecystitis with cholelithiasis and possible choledocholithiasis  I explained the diagnosis to the patient in detail. Despite him feeling better, it is recommended that he be admitted to the hospital for IV antibiotics and cholecystectomy. Laparoscopic cholecystectomy may or may not be performed today pending on the OR schedule. If his liver functions or increased, he may need a GI consultation for consideration of ERCP.  Caedon Bond A 03/25/2014, 6:17 AM

## 2014-03-26 ENCOUNTER — Encounter (HOSPITAL_COMMUNITY): Payer: Self-pay | Admitting: General Surgery

## 2014-03-26 DIAGNOSIS — K8050 Calculus of bile duct without cholangitis or cholecystitis without obstruction: Secondary | ICD-10-CM | POA: Diagnosis present

## 2014-03-26 DIAGNOSIS — K8001 Calculus of gallbladder with acute cholecystitis with obstruction: Secondary | ICD-10-CM | POA: Diagnosis present

## 2014-03-26 LAB — COMPREHENSIVE METABOLIC PANEL
ALBUMIN: 3.5 g/dL (ref 3.5–5.2)
ALT: 107 U/L — ABNORMAL HIGH (ref 0–53)
ANION GAP: 8 (ref 5–15)
AST: 121 U/L — ABNORMAL HIGH (ref 0–37)
Alkaline Phosphatase: 81 U/L (ref 39–117)
BUN: 7 mg/dL (ref 6–23)
CO2: 26 mmol/L (ref 19–32)
Calcium: 8.6 mg/dL (ref 8.4–10.5)
Chloride: 106 mEq/L (ref 96–112)
Creatinine, Ser: 1.07 mg/dL (ref 0.50–1.35)
GFR calc Af Amer: 79 mL/min — ABNORMAL LOW (ref >90)
GFR calc non Af Amer: 68 mL/min — ABNORMAL LOW (ref >90)
Glucose, Bld: 112 mg/dL — ABNORMAL HIGH (ref 70–99)
Potassium: 3.6 mmol/L (ref 3.5–5.1)
Sodium: 140 mmol/L (ref 135–145)
TOTAL PROTEIN: 6.3 g/dL (ref 6.0–8.3)
Total Bilirubin: 1.1 mg/dL (ref 0.3–1.2)

## 2014-03-26 LAB — CBC
HEMATOCRIT: 37.4 % — AB (ref 39.0–52.0)
Hemoglobin: 11.7 g/dL — ABNORMAL LOW (ref 13.0–17.0)
MCH: 26.8 pg (ref 26.0–34.0)
MCHC: 31.3 g/dL (ref 30.0–36.0)
MCV: 85.6 fL (ref 78.0–100.0)
Platelets: 171 10*3/uL (ref 150–400)
RBC: 4.37 MIL/uL (ref 4.22–5.81)
RDW: 14.4 % (ref 11.5–15.5)
WBC: 5.7 10*3/uL (ref 4.0–10.5)

## 2014-03-26 MED ORDER — OXYCODONE HCL 5 MG PO TABS: 5.0000 mg | ORAL_TABLET | Freq: Four times a day (QID) | ORAL | Status: DC | PRN

## 2014-03-26 NOTE — Progress Notes (Signed)
UR completed 

## 2014-03-26 NOTE — Discharge Instructions (Signed)

## 2014-03-26 NOTE — Discharge Summary (Signed)
[facility] Discharge Summary   Patient ID: Corena PilgrimDonald L Gallego MRN: [id number] DOB/AGE: 70/09/1945 70 y.o.  Admit date: 03/25/2014 Discharge date: 03/26/2014  Admitting Diagnosis: Cholecystitis  Discharge Diagnosis Patient Active Problem List   Diagnosis Date Noted  . Cholelithiasis and acute cholecystitis with obstruction 03/26/2014  . Choledocholithiasis 03/26/2014  . Cholecystitis 03/25/2014    Consultants None  Imaging: Dg Cholangiogram Operative  03/25/2014   CLINICAL DATA:  68 year old male with cholecystitis  EXAM: INTRAOPERATIVE CHOLANGIOGRAM  TECHNIQUE: Cholangiographic images from the C-arm fluoroscopic device were submitted for interpretation post-operatively. Please see the procedural report for the amount of contrast and the fluoroscopy time utilized.  COMPARISON:  Abdominal ultrasound and CT abdomen/pelvis obtained earlier today 03/25/2014  FINDINGS: Images obtained by intraoperative cholangiogram at the time of laparoscopic cholecystectomy demonstrate cannulation of the cystic duct remnant and opacification of the intra and extrahepatic biliary tree. There is a mild dilatation of the common hepatic duct. However, no choledocholithiasis, stricture, stenosis or mass is identified. Contrast material passes through the ampulla and into the duodenum.  IMPRESSION: Dilatation of the common bile duct without evidence of choledocholithiasis, stenosis or stricture.  Contrast material passes through the ampulla and into the duodenum.   Electronically Signed   By: Malachy MoanHeath  McCullough M.D.   On: 03/25/2014 15:39   Ct Abdomen Pelvis W Contrast  03/25/2014   CLINICAL DATA:  Abdominal pain 6 hours after eating  EXAM: CT ABDOMEN AND PELVIS WITH CONTRAST  TECHNIQUE: Multidetector CT imaging of the abdomen and pelvis was performed using the standard protocol following bolus administration of intravenous contrast.  CONTRAST:  100mL OMNIPAQUE IOHEXOL 300 MG/ML  SOLN  COMPARISON:   None currently available.  FINDINGS: BODY WALL: Unremarkable.  LOWER CHEST: Coronary atherosclerosis. There is mild dependent atelectasis. Reticular nodular densities in the inferior lingula favor scarring given the history.  ABDOMEN/PELVIS:  Liver: No focal abnormality.  Biliary: There is mild gallbladder wall thickening or pericholecystic edema. No calcified gallstone. No evidence for choledocholithiasis.  Pancreas: Unremarkable.  Spleen: Unremarkable.  Adrenals: Unremarkable.  Kidneys and ureters: Numerous bilateral renal cysts. No hydronephrosis or ureteral calculus.  Bladder: Unremarkable.  Reproductive: Multi nodular prostate, mildly deforming the bladder base.  Bowel: No bowel obstruction. There are a few scattered colonic diverticula. Ileocolic resection, presumably for appendicitis reported 07/23/2002.  Retroperitoneum: No mass or adenopathy.  Peritoneum: No ascites or pneumoperitoneum.  Vascular: No acute abnormality.  OSSEOUS: Mid and lower lumbar facet arthropathy with bilateral foraminal stenosis. There has been a L5 laminotomy on the right.  IMPRESSION: Mild gallbladder wall thickening or pericholecystic edema. No calcified cholelithiasis, consider follow-up right upper quadrant ultrasound.   Electronically Signed   By: Tiburcio PeaJonathan  Watts M.D.   On: 03/25/2014 04:05   Dg Chest Port 1 View  03/25/2014   CLINICAL DATA:  Epigastric and chest pain, vomiting.  EXAM: PORTABLE CHEST - 1 VIEW  COMPARISON:  None.  FINDINGS: Heart is borderline in size. Bibasilar opacities likely reflect atelectasis. No effusions. No acute bony abnormality.  IMPRESSION: Bibasilar opacities likely reflect atelectasis.   Electronically Signed   By: Charlett NoseKevin  Dover M.D.   On: 03/25/2014 01:12   [place] Abdomen Limited Ruq  03/25/2014   CLINICAL DATA:  Initial evaluation for mid abdominal pain. Vomiting.  EXAM: US ABDOMEN LIMITED - RIGHT UPPER QUADRANT  COMPARISON:  Prior CT from earlier the same day.  FINDINGS: Gallbladder:  Multiple  mobile bile echogenic stones present within the gallbladder lumen . Few were present within the  gallbladder neck. Gallbladder wall was thickened up to 7.8 mm. Small amount of free pericholecystic fluid. No sonographic Murphy sign elicited on exam.  Common bile duct:  Diameter: Dilated up to 10.7 mm.  Liver:  Diffusely increased echogenicity consistent with steatosis. Focal fatty sparing noted adjacent to the gallbladder fossa.  IMPRESSION: 1. Cholelithiasis with gallbladder wall thickening and small amount of free pericholecystic fluid. No sonographic Murphy sign elicited on exam. Clinical correlation for possible acute cholecystitis recommended. 2. Dilated common bile duct up to 11 mm. No obstructive choledocholithiasis identified. 3. Hepatic steatosis.   Electronically Signed   By: Rise MuBenjamin  McClintock M.D.   On: 03/25/2014 05:39    Procedures Dr.Thompson (03/26/14) - Laparoscopic lysis of adhesions for 15min, [facility]   Hospital Course:  This gentleman presents with sudden onset of moderate to severe epigastric and right upper quadrant abdominal pain with nausea and vomiting after a fatty meal. He presented to the emergency department. He was found on CAT scan to have findings consistent with cholecystitis. Ultrasound confirmed this. He also has a dilated common bile duct. He now reports that he is pain-free. He has had a recent similar attack which was not as severe. Bowel movements are normal. He denies chest pain or shortness of breath. He is otherwise without complaints.  Workup showed acute cholecystitis with cholelithiasis and possible choledocholithiasis.  Patient was admitted and underwent procedure listed above.  Tolerated procedure well and was transferred to the floor.  Dr. Janee Mornhompson was able to milk a stone from the CBD and evacuate it intraoperatively.   Diet was advanced as tolerated.  On POD #1, the patient was voiding well, tolerating diet, ambulating well,  pain well controlled, vital signs stable, incisions c/d/i and felt stable for discharge home.  Patient will follow up in our office in 2 weeks and knows to call with questions or concerns.  Physical Exam: General:  Alert, NAD, pleasant, comfortable Abd:  Soft, ND, mild tenderness, incisions C/D/I    Medication List    TAKE these medications        amLODipine 10 MG tablet  Commonly known as:  NORVASC  Take 10 mg by mouth daily.     aspirin EC 81 MG tablet  Take 81 mg by mouth daily.     metFORMIN 500 MG tablet  Commonly known as:  GLUCOPHAGE  Take 500 mg by mouth daily with breakfast.     oxyCODONE 5 MG immediate release tablet  Commonly known as:  Oxy IR/ROXICODONE  Take 1-2 tablets (5-10 mg total) by mouth every 6 (six) hours as needed (pain).         Follow-up Information    [facility] [place] [facility].   Why:  For post-operation check. Your appointment is at 1:45pm, please arrive at least 30 min before your appointment to complete your check in paperwork.  If you are unable to arrive 30 min prior to your appointment time we may have to cancel or reschedule you   Contact information:   [address]   [place] [address] [phone]       Signed: Candiss NorseMegan Dort, PA-C [facility] [phone]  03/26/2014, 7:44 AM

## 2014-03-26 NOTE — Progress Notes (Signed)
Patient given discharge instructions and prescription.  He verbalized understanding of all instructions and follow-up information.  No questions or concerns at this time.  Taken down via wheelchair for discharge home with family.

## 2015-03-10 ENCOUNTER — Encounter: Payer: Self-pay | Admitting: Internal Medicine

## 2015-09-04 ENCOUNTER — Emergency Department (HOSPITAL_COMMUNITY)
Admission: EM | Admit: 2015-09-04 | Discharge: 2015-09-04 | Disposition: A | Discharge status: Home / Self Care | Payer: Worker's Compensation | Attending: Emergency Medicine | Admitting: Emergency Medicine

## 2015-09-04 ENCOUNTER — Emergency Department (HOSPITAL_COMMUNITY): Payer: Worker's Compensation

## 2015-09-04 ENCOUNTER — Encounter (HOSPITAL_COMMUNITY): Payer: Self-pay | Admitting: *Deleted

## 2015-09-04 DIAGNOSIS — Z87891 Personal history of nicotine dependence: Secondary | ICD-10-CM | POA: Diagnosis not present

## 2015-09-04 DIAGNOSIS — Y929 Unspecified place or not applicable: Secondary | ICD-10-CM | POA: Diagnosis not present

## 2015-09-04 DIAGNOSIS — Z79899 Other long term (current) drug therapy: Secondary | ICD-10-CM | POA: Diagnosis not present

## 2015-09-04 DIAGNOSIS — S52392A Other fracture of shaft of radius, left arm, initial encounter for closed fracture: Secondary | ICD-10-CM | POA: Insufficient documentation

## 2015-09-04 DIAGNOSIS — E119 Type 2 diabetes mellitus without complications: Secondary | ICD-10-CM | POA: Insufficient documentation

## 2015-09-04 DIAGNOSIS — Y939 Activity, unspecified: Secondary | ICD-10-CM | POA: Diagnosis not present

## 2015-09-04 DIAGNOSIS — Z7984 Long term (current) use of oral hypoglycemic drugs: Secondary | ICD-10-CM | POA: Diagnosis not present

## 2015-09-04 DIAGNOSIS — W230XXA Caught, crushed, jammed, or pinched between moving objects, initial encounter: Secondary | ICD-10-CM | POA: Diagnosis not present

## 2015-09-04 DIAGNOSIS — S5292XA Unspecified fracture of left forearm, initial encounter for closed fracture: Secondary | ICD-10-CM

## 2015-09-04 DIAGNOSIS — Z7982 Long term (current) use of aspirin: Secondary | ICD-10-CM | POA: Insufficient documentation

## 2015-09-04 DIAGNOSIS — Y990 Civilian activity done for income or pay: Secondary | ICD-10-CM | POA: Insufficient documentation

## 2015-09-04 DIAGNOSIS — I10 Essential (primary) hypertension: Secondary | ICD-10-CM | POA: Diagnosis not present

## 2015-09-04 DIAGNOSIS — S4992XA Unspecified injury of left shoulder and upper arm, initial encounter: Secondary | ICD-10-CM | POA: Diagnosis present

## 2015-09-04 MED ORDER — HYDROCODONE-ACETAMINOPHEN 5-325 MG PO TABS
2.0000 | ORAL_TABLET | Freq: Once | ORAL | Status: AC
  Administered 2015-09-04: 2 via ORAL
  Filled 2015-09-04: qty 2

## 2015-09-04 MED ORDER — HYDROMORPHONE HCL 1 MG/ML IJ SOLN
1.0000 mg | Freq: Once | INTRAMUSCULAR | Status: AC
  Administered 2015-09-04: 1 mg via INTRAMUSCULAR
  Filled 2015-09-04: qty 1

## 2015-09-04 MED ORDER — HYDROCODONE-ACETAMINOPHEN 5-325 MG PO TABS: 1.0000 | ORAL_TABLET | Freq: Four times a day (QID) | ORAL | Status: DC | PRN

## 2015-09-04 MED ORDER — TETANUS-DIPHTH-ACELL PERTUSSIS 5-2.5-18.5 LF-MCG/0.5 IM SUSP: 0.5000 mL | Freq: Once | INTRAMUSCULAR | Status: DC

## 2015-09-04 NOTE — Discharge Instructions (Signed)
Forearm Fracture °A forearm fracture is a break in one or both of the bones of your arm that are between the elbow and the wrist. Your forearm is made up of two bones: °· Radius. This is the bone on the inside of your arm near your thumb. °· Ulna. This is the bone on the outside of your arm near your little finger. °Middle forearm fractures usually break both the radius and the ulna. Most forearm fractures that involve both the ulna and radius will require surgery. °CAUSES °Common causes of this type of fracture include: °· Falling on an outstretched arm. °· Accidents, such as a car or bike accident. °· A hard, direct hit to the middle part of your arm. °RISK FACTORS °You may be at higher risk for this type of fracture if: °· You play contact sports. °· You have a condition that causes your bones to be weak or thin (osteoporosis). °SIGNS AND SYMPTOMS °A forearm fracture causes pain immediately after the injury. Other signs and symptoms include: °· An abnormal bend or bump in your arm (deformity). °· Swelling. °· Numbness or tingling. °· Tenderness. °· Inability to turn your hand from side to side (rotate). °· Bruising. °DIAGNOSIS °Your health care provider may diagnose a forearm fracture based on: °· Your symptoms. °· Your medical history, including any recent injury. °· A physical exam. Your health care provider will look for any deformity and feel for tenderness over the break. Your health care provider will also check whether the bones are out of place. °· An X-ray exam to confirm the diagnosis and learn more about the type of fracture. °TREATMENT °The goals of treatment are to get the bone or bones in proper position for healing and to keep the bones from moving so they will heal over time. Your treatment will depend on many factors, especially the type of fracture that you have. °· If the fractured bone or bones: °¨ Are in the correct position (nondisplaced), you may only need to wear a cast or a  splint. °¨ Have a slightly displaced fracture, you may need to have the bones moved back into place manually (closed reduction) before the splint or cast is put on. °· You may have a temporary splint before you have a cast. The splint allows room for some swelling. After a few days, a cast can replace the splint. °· You may have to wear the cast for 6-8 weeks or as directed by your health care provider. °· The cast may be changed after about 3 weeks or as directed by your health care provider. °· After your cast is removed, you may need physical therapy to regain full movement in your wrist or elbow. °· You may need emergency surgery if you have: °¨ A fractured bone or bones that are out of position (displaced). °¨ A fracture with multiple fragments (comminuted fracture). °¨ A fracture that breaks the skin (open fracture). This type of fracture may require surgical wires, plates, or screws to hold the bone or bones in place. °· You may have X-rays every couple of weeks to check on your healing. °HOME CARE INSTRUCTIONS °If You Have a Cast: °· Do not stick anything inside the cast to scratch your skin. Doing that increases your risk of infection. °· Check the skin around the cast every day. Report any concerns to your health care provider. You may put lotion on dry skin around the edges of the cast. Do not apply lotion to the skin   underneath the cast. °If You Have a Splint: °· Wear it as directed by your health care provider. Remove it only as directed by your health care provider. °· Loosen the splint if your fingers become numb and tingle, or if they turn cold and blue. °Bathing °· Cover the cast or splint with a watertight plastic bag to protect it from water while you bathe or shower. Do not let the cast or splint get wet. °Managing Pain, Stiffness, and Swelling °· If directed, apply ice to the injured area: °¨ Put ice in a plastic bag. °¨ Place a towel between your skin and the bag. °¨ Leave the ice on for 20  minutes, 2-3 times a day. °· Move your fingers often to avoid stiffness and to lessen swelling. °· Raise the injured area above the level of your heart while you are sitting or lying down. °Driving °· Do not drive or operate heavy machinery while taking pain medicine. °· Do not drive while wearing a cast or splint on a hand that you use for driving. °Activity °· Return to your normal activities as directed by your health care provider. Ask your health care provider what activities are safe for you. °· Perform range-of-motion exercises only as directed by your health care provider. °Safety °· Do not use your injured limb to support your body weight until your health care provider says that you can. °General Instructions °· Do not put pressure on any part of the cast or splint until it is fully hardened. This may take several hours. °· Keep the cast or splint clean and dry. °· Do not use any tobacco products, including cigarettes, chewing tobacco, or electronic cigarettes. Tobacco can delay bone healing. If you need help quitting, ask your health care provider. °· Take medicines only as directed by your health care provider. °· Keep all follow-up visits as directed by your health care provider. This is important. °SEEK MEDICAL CARE IF: °· Your pain medicine is not helping. °· Your cast or splint becomes wet or damaged or suddenly feels too tight. °· Your cast becomes loose. °· You have more severe pain or swelling than you did before the cast. °· You have severe pain when you stretch your fingers. °· You continue to have pain or stiffness in your elbow or your wrist after your cast is removed. °SEEK IMMEDIATE MEDICAL CARE IF: °· You cannot move your fingers. °· You lose feeling in your fingers or your hand. °· Your hand or your fingers turn cold and pale or blue. °· You notice a bad smell coming from your cast. °· You have drainage from underneath your cast. °· You have new stains from blood or drainage that is coming  through your cast. °  °This information is not intended to replace advice given to you by your health care provider. Make sure you discuss any questions you have with your health care provider. °  °Document Released: 03/12/2000 Document Revised: 04/05/2014 Document Reviewed: 10/29/2013 °[employer] Interactive Patient Education ©2016 [employer] ° °

## 2015-09-04 NOTE — ED Provider Notes (Signed)
CSN: [id number]     Arrival date & time 09/04/15  0732 History   First MD Initiated Contact with Patient 09/04/15 [phone]     Chief Complaint  Patient presents with  . Arm Injury     (Consider location/radiation/quality/duration/timing/severity/associated sxs/prior Treatment) HPI Comments: Patient presents to the emergency department with chief complaint left arm injury. He states that he smashed his left arm in between a trailer and truck bed this morning. He reports severe pain. Pain is worsened with palpation and movement. He has not taken anything to alleviate his symptoms. There are no radiating symptoms.  The history is provided by the patient. No language interpreter was used.    Past Medical History  Diagnosis Date  . Hypertension   . Diabetes mellitus without complication [facility](HCC)    Past Surgical History  Procedure Laterality Date  . Appendectomy    . Back surgery      ruptured disk  . Cholecystectomy N/A 03/25/2014    Procedure: LAPAROSCOPIC CHOLECYSTECTOMY WITH INTRAOPERATIVE CHOLANGIOGRAM;  Surgeon: Violeta GelinasBurke Thompson, MD;  [facility];  Service: General;  Laterality: N/A;  . Lysis of adhesion  03/25/2014    Procedure: Laparascopic LYSIS OF ADHESIONs ;  Surgeon: Violeta GelinasBurke Thompson, MD;  Location: [facility] OR;  Service: General;;   History reviewed. No pertinent family history. Social History  Substance Use Topics  . Smoking status: Former Games developermoker  . Smokeless tobacco: Never Used     Comment: quit smoking in 1989  . Alcohol Use: No    Review of Systems  Musculoskeletal: Positive for arthralgias.  Skin: Positive for wound.  All other systems reviewed and are negative.     Allergies  Oxycodone  Home Medications   Prior to Admission medications   Medication Sig Start Date End Date Taking? Authorizing Provider  amLODipine (NORVASC) 10 MG tablet Take 10 mg by mouth daily.    Historical Provider, MD  aspirin EC 81 MG tablet Take 81 mg by mouth daily.    Historical Provider,  MD  metFORMIN (GLUCOPHAGE) 500 MG tablet Take 500 mg by mouth daily with breakfast.    Historical Provider, MD  oxyCODONE (OXY IR/ROXICODONE) 5 MG immediate release tablet Take 1-2 tablets (5-10 mg total) by mouth every 6 (six) hours as needed (pain). 03/26/14   Nonie HoyerMegan N Baird, PA-C   There were no vitals taken for this visit. Physical Exam  Constitutional: He is oriented to person, place, and time. He appears well-developed and well-nourished.  HENT:  Head: Normocephalic and atraumatic.  Eyes: Conjunctivae and EOM are normal.  Neck: Normal range of motion.  Cardiovascular: Normal rate and intact distal pulses.   Intact distal pulses with brisk cap refill  Pulmonary/Chest: Effort normal.  Abdominal: He exhibits no distension.  Musculoskeletal: Normal range of motion.  Left forearm remarkable for swelling and mild deformity at the distal aspects, range of motion and strength limited secondary to pain  Neurological: He is alert and oriented to person, place, and time.  Skin: Skin is dry.  Mild abrasions on left forearm, no puncture wounds, no tenting of skin, no evidence of open fracture  Psychiatric: He has a normal mood and affect. His behavior is normal. Judgment and thought content normal.  Nursing note and vitals reviewed.   ED Course  Procedures (including critical care time)  Imaging Review Dg Forearm Left  09/04/2015  CLINICAL DATA:  Smashed forearm this morning between trailer and truck bed EXAM: LEFT FOREARM - 2 VIEW COMPARISON:  None. FINDINGS: Two  views of the left forearm submitted. There is displaced minimal comminuted fracture distal shaft of left radius. There is about 1.1 cm separation of bony fragments. There is soft tissue swelling distal forearm. IMPRESSION: Displaced minimal comminuted fracture distal shaft of left radius. Electronically Signed   By: Natasha Mead M.D.   On: 09/04/2015 08:38   I have personally reviewed and evaluated these images and lab results as part  of my medical decision-making.  SPLINT APPLICATION Date/Time: 10:29 AM Authorized by: Roxy Horseman Consent: Verbal consent obtained. Risks and benefits: risks, benefits and alternatives were discussed Consent given by: patient Splint applied by: orthopedic technician Location details: left forearm Splint type: sugar tong Supplies used: fiberglass and ace Post-procedure: The splinted body part was neurovascularly unchanged following the procedure. Patient tolerance: Patient tolerated the procedure well with no immediate complications.     MDM   Final diagnoses:  Radius fracture, left, closed, initial encounter    Patient with left arm injury.  Will check plain films and treat pain.  Tdap up to date.  X-rays as above.  Reviewed with Dr. Donnald Garre, who agrees with plan for sugar tong splint and ortho follow-up.  Patient seen by and discussed with Dr. Donnald Garre.    Roxy Horseman, PA-C 09/04/15 1030  Arby Barrette, MD 09/15/15 [phone]

## 2015-09-04 NOTE — ED Notes (Signed)
Patient transported to X-ray 

## 2015-09-04 NOTE — ED Notes (Signed)
Pt reports injuring left arm today at work, obv deformity noted. +radial pulse.

## 2015-09-04 NOTE — Progress Notes (Signed)
Orthopedic Tech Progress Note Patient Details:  Cameron PilgrimDonald L Harrison 08/14/1945 [id number]  Ortho Devices Type of Ortho Device: Arm sling, Sugartong splint Ortho Device/Splint Interventions: Application   Cameron FordyceJennifer C Jonnelle Harrison 09/04/2015, 10:34 AM

## 2015-09-04 NOTE — ED Notes (Signed)
3 wounds to L forearm cleaned with saline, dried with bacitracin applied and secured with bandaids.  ice pack reapplied, pt awaiting ortho tech to apply splint.

## 2015-09-22 ENCOUNTER — Other Ambulatory Visit: Payer: Self-pay | Admitting: Orthopaedic Surgery

## 2015-09-23 ENCOUNTER — Encounter (HOSPITAL_BASED_OUTPATIENT_CLINIC_OR_DEPARTMENT_OTHER): Payer: Self-pay | Admitting: *Deleted

## 2015-09-23 ENCOUNTER — Encounter (HOSPITAL_BASED_OUTPATIENT_CLINIC_OR_DEPARTMENT_OTHER)
Admission: RE | Admit: 2015-09-23 | Discharge: 2015-09-23 | Disposition: A | Discharge status: Home / Self Care | Payer: Medicare HMO | Source: Ambulatory Visit | Attending: Orthopaedic Surgery | Admitting: Orthopaedic Surgery

## 2015-09-23 DIAGNOSIS — S52302A Unspecified fracture of shaft of left radius, initial encounter for closed fracture: Secondary | ICD-10-CM | POA: Diagnosis present

## 2015-09-23 DIAGNOSIS — Z01818 Encounter for other preprocedural examination: Secondary | ICD-10-CM | POA: Insufficient documentation

## 2015-09-23 DIAGNOSIS — E119 Type 2 diabetes mellitus without complications: Secondary | ICD-10-CM | POA: Diagnosis not present

## 2015-09-23 DIAGNOSIS — X58XXXA Exposure to other specified factors, initial encounter: Secondary | ICD-10-CM | POA: Diagnosis not present

## 2015-09-23 DIAGNOSIS — Z7982 Long term (current) use of aspirin: Secondary | ICD-10-CM | POA: Diagnosis not present

## 2015-09-23 DIAGNOSIS — Z87891 Personal history of nicotine dependence: Secondary | ICD-10-CM | POA: Diagnosis not present

## 2015-09-23 DIAGNOSIS — Z7984 Long term (current) use of oral hypoglycemic drugs: Secondary | ICD-10-CM | POA: Diagnosis not present

## 2015-09-23 DIAGNOSIS — I10 Essential (primary) hypertension: Secondary | ICD-10-CM | POA: Diagnosis not present

## 2015-09-23 DIAGNOSIS — Z79899 Other long term (current) drug therapy: Secondary | ICD-10-CM | POA: Diagnosis not present

## 2015-09-23 DIAGNOSIS — S52372A Galeazzi's fracture of left radius, initial encounter for closed fracture: Secondary | ICD-10-CM | POA: Diagnosis not present

## 2015-09-23 LAB — BASIC METABOLIC PANEL
ANION GAP: 7 (ref 5–15)
BUN: 13 mg/dL (ref 6–20)
CO2: 27 mmol/L (ref 22–32)
Calcium: 9.6 mg/dL (ref 8.9–10.3)
Chloride: 106 mmol/L (ref 101–111)
Creatinine, Ser: 1.16 mg/dL (ref 0.61–1.24)
GFR calc Af Amer: >60 mL/min (ref >60)
GFR calc non Af Amer: >60 mL/min (ref >60)
GLUCOSE: 115 mg/dL — AB (ref 65–99)
POTASSIUM: 4.1 mmol/L (ref 3.5–5.1)
Sodium: 140 mmol/L (ref 135–145)

## 2015-09-24 ENCOUNTER — Encounter (HOSPITAL_BASED_OUTPATIENT_CLINIC_OR_DEPARTMENT_OTHER): Payer: Self-pay | Admitting: Anesthesiology

## 2015-09-24 ENCOUNTER — Ambulatory Visit (HOSPITAL_COMMUNITY): Payer: Worker's Compensation

## 2015-09-24 ENCOUNTER — Ambulatory Visit (HOSPITAL_BASED_OUTPATIENT_CLINIC_OR_DEPARTMENT_OTHER): Payer: Worker's Compensation | Admitting: Anesthesiology

## 2015-09-24 ENCOUNTER — Ambulatory Visit (HOSPITAL_BASED_OUTPATIENT_CLINIC_OR_DEPARTMENT_OTHER)
Admission: RE | Admit: 2015-09-24 | Discharge: 2015-09-24 | Disposition: A | Discharge status: Home / Self Care | Payer: Worker's Compensation | Source: Ambulatory Visit | Attending: Orthopaedic Surgery | Admitting: Orthopaedic Surgery

## 2015-09-24 ENCOUNTER — Encounter (HOSPITAL_BASED_OUTPATIENT_CLINIC_OR_DEPARTMENT_OTHER)
Admission: RE | Disposition: A | Discharge status: Home / Self Care | Payer: Self-pay | Source: Ambulatory Visit | Attending: Orthopaedic Surgery

## 2015-09-24 DIAGNOSIS — Z79899 Other long term (current) drug therapy: Secondary | ICD-10-CM | POA: Insufficient documentation

## 2015-09-24 DIAGNOSIS — I10 Essential (primary) hypertension: Secondary | ICD-10-CM | POA: Insufficient documentation

## 2015-09-24 DIAGNOSIS — E119 Type 2 diabetes mellitus without complications: Secondary | ICD-10-CM | POA: Insufficient documentation

## 2015-09-24 DIAGNOSIS — Z87891 Personal history of nicotine dependence: Secondary | ICD-10-CM | POA: Diagnosis not present

## 2015-09-24 DIAGNOSIS — S52372A Galeazzi's fracture of left radius, initial encounter for closed fracture: Secondary | ICD-10-CM | POA: Insufficient documentation

## 2015-09-24 DIAGNOSIS — Z7982 Long term (current) use of aspirin: Secondary | ICD-10-CM | POA: Insufficient documentation

## 2015-09-24 DIAGNOSIS — X58XXXA Exposure to other specified factors, initial encounter: Secondary | ICD-10-CM | POA: Insufficient documentation

## 2015-09-24 DIAGNOSIS — S52309A Unspecified fracture of shaft of unspecified radius, initial encounter for closed fracture: Secondary | ICD-10-CM

## 2015-09-24 DIAGNOSIS — Z7984 Long term (current) use of oral hypoglycemic drugs: Secondary | ICD-10-CM | POA: Insufficient documentation

## 2015-09-24 HISTORY — PX: PERCUTANEOUS PINNING: SHX2209

## 2015-09-24 HISTORY — PX: OPEN REDUCTION INTERNAL FIXATION (ORIF) DISTAL RADIAL FRACTURE: SHX5989

## 2015-09-24 HISTORY — DX: Galeazzi's fracture of left radius, initial encounter for closed fracture: S52.372A

## 2015-09-24 LAB — GLUCOSE, CAPILLARY
GLUCOSE-CAPILLARY: 100 mg/dL — AB (ref 65–99)
Glucose-Capillary: 127 mg/dL — ABNORMAL HIGH (ref 65–99)

## 2015-09-24 SURGERY — OPEN REDUCTION INTERNAL FIXATION (ORIF) DISTAL RADIUS FRACTURE
Anesthesia: General | Site: Wrist | Laterality: Left

## 2015-09-24 MED ORDER — SCOPOLAMINE 1 MG/3DAYS TD PT72: 1.0000 | MEDICATED_PATCH | Freq: Once | TRANSDERMAL | Status: DC | PRN

## 2015-09-24 MED ORDER — MEPERIDINE HCL 25 MG/ML IJ SOLN: 6.2500 mg | INTRAMUSCULAR | Status: DC | PRN

## 2015-09-24 MED ORDER — GLYCOPYRROLATE 0.2 MG/ML IJ SOLN: 0.2000 mg | Freq: Once | INTRAMUSCULAR | Status: DC | PRN

## 2015-09-24 MED ORDER — PROPOFOL 10 MG/ML IV BOLUS
INTRAVENOUS | Status: DC | PRN
  Administered 2015-09-24: 300 mg via INTRAVENOUS

## 2015-09-24 MED ORDER — MIDAZOLAM HCL 2 MG/2ML IJ SOLN
INTRAMUSCULAR | Status: AC
  Filled 2015-09-24: qty 2

## 2015-09-24 MED ORDER — HYDROMORPHONE HCL 2 MG PO TABS: 2.0000 mg | ORAL_TABLET | ORAL | Status: DC | PRN

## 2015-09-24 MED ORDER — EPHEDRINE SULFATE 50 MG/ML IJ SOLN
INTRAMUSCULAR | Status: DC | PRN
  Administered 2015-09-24 (×2): 10 mg via INTRAVENOUS

## 2015-09-24 MED ORDER — ONDANSETRON HCL 4 MG/2ML IJ SOLN
INTRAMUSCULAR | Status: DC | PRN
  Administered 2015-09-24: 4 mg via INTRAVENOUS

## 2015-09-24 MED ORDER — FENTANYL CITRATE (PF) 100 MCG/2ML IJ SOLN
INTRAMUSCULAR | Status: AC
  Filled 2015-09-24: qty 2

## 2015-09-24 MED ORDER — CEFAZOLIN SODIUM-DEXTROSE 2-4 GM/100ML-% IV SOLN
INTRAVENOUS | Status: AC
  Filled 2015-09-24: qty 100

## 2015-09-24 MED ORDER — LIDOCAINE 2% (20 MG/ML) 5 ML SYRINGE
INTRAMUSCULAR | Status: DC | PRN
  Administered 2015-09-24: 100 mg via INTRAVENOUS

## 2015-09-24 MED ORDER — METHOCARBAMOL 750 MG PO TABS: 750.0000 mg | ORAL_TABLET | Freq: Two times a day (BID) | ORAL | Status: DC | PRN

## 2015-09-24 MED ORDER — SENNOSIDES-DOCUSATE SODIUM 8.6-50 MG PO TABS: 1.0000 | ORAL_TABLET | Freq: Every evening | ORAL | Status: DC | PRN

## 2015-09-24 MED ORDER — ONDANSETRON HCL 4 MG PO TABS: 4.0000 mg | ORAL_TABLET | Freq: Three times a day (TID) | ORAL | Status: DC | PRN

## 2015-09-24 MED ORDER — BUPIVACAINE-EPINEPHRINE (PF) 0.5% -1:200000 IJ SOLN
INTRAMUSCULAR | Status: DC | PRN
  Administered 2015-09-24: 25 mL via PERINEURAL

## 2015-09-24 MED ORDER — OXYCODONE HCL 5 MG/5ML PO SOLN: 5.0000 mg | Freq: Once | ORAL | Status: DC | PRN

## 2015-09-24 MED ORDER — FENTANYL CITRATE (PF) 100 MCG/2ML IJ SOLN
50.0000 ug | INTRAMUSCULAR | Status: DC | PRN
  Administered 2015-09-24: 100 ug via INTRAVENOUS

## 2015-09-24 MED ORDER — OXYCODONE HCL 5 MG PO TABS: 5.0000 mg | ORAL_TABLET | Freq: Once | ORAL | Status: DC | PRN

## 2015-09-24 MED ORDER — LACTATED RINGERS IV SOLN
INTRAVENOUS | Status: DC
  Administered 2015-09-24 (×2): via INTRAVENOUS

## 2015-09-24 MED ORDER — CEFAZOLIN SODIUM-DEXTROSE 2-4 GM/100ML-% IV SOLN
2.0000 g | INTRAVENOUS | Status: AC
  Administered 2015-09-24: 2 g via INTRAVENOUS

## 2015-09-24 MED ORDER — DEXAMETHASONE SODIUM PHOSPHATE 10 MG/ML IJ SOLN
INTRAMUSCULAR | Status: DC | PRN
  Administered 2015-09-24: 5 mg via INTRAVENOUS

## 2015-09-24 MED ORDER — MIDAZOLAM HCL 2 MG/2ML IJ SOLN
1.0000 mg | INTRAMUSCULAR | Status: DC | PRN
  Administered 2015-09-24: 2 mg via INTRAVENOUS

## 2015-09-24 MED ORDER — HYDROMORPHONE HCL 1 MG/ML IJ SOLN: 0.2500 mg | INTRAMUSCULAR | Status: DC | PRN

## 2015-09-24 MED ORDER — 0.9 % SODIUM CHLORIDE (POUR BTL) OPTIME
TOPICAL | Status: DC | PRN
  Administered 2015-09-24: 2500 mL

## 2015-09-24 SURGICAL SUPPLY — 71 items
BANDAGE ACE 3X5.8 VEL STRL LF (GAUZE/BANDAGES/DRESSINGS) ×1 IMPLANT
BANDAGE ACE 4X5 VEL STRL LF (GAUZE/BANDAGES/DRESSINGS) ×1 IMPLANT
BIT DRILL 2.5X110 QC LCP DISP (BIT) ×1 IMPLANT
BLADE MINI RND TIP GREEN BEAV (BLADE) IMPLANT
BLADE SURG 15 STRL LF DISP TIS (BLADE) ×2 IMPLANT
BLADE SURG 15 STRL SS (BLADE) ×4
BNDG CMPR 9X4 STRL LF SNTH (GAUZE/BANDAGES/DRESSINGS) ×1
BNDG ESMARK 4X9 LF (GAUZE/BANDAGES/DRESSINGS) ×2 IMPLANT
BRUSH SCRUB EZ PLAIN DRY (MISCELLANEOUS) ×2 IMPLANT
CORDS BIPOLAR (ELECTRODE) ×2 IMPLANT
COVER BACK TABLE 60X90IN (DRAPES) ×2 IMPLANT
COVER MAYO STAND STRL (DRAPES) ×2 IMPLANT
CUFF TOURNIQUET SINGLE 18IN (TOURNIQUET CUFF) ×1 IMPLANT
DECANTER SPIKE VIAL GLASS SM (MISCELLANEOUS) IMPLANT
DRAPE C-ARM 42X72 X-RAY (DRAPES) ×2 IMPLANT
DRAPE EXTREMITY T 121X128X90 (DRAPE) ×2 IMPLANT
DRAPE SURG 17X23 STRL (DRAPES) ×2 IMPLANT
GAUZE SPONGE 4X4 12PLY STRL (GAUZE/BANDAGES/DRESSINGS) ×2 IMPLANT
GAUZE SPONGE 4X4 16PLY XRAY LF (GAUZE/BANDAGES/DRESSINGS) IMPLANT
GAUZE XEROFORM 1X8 LF (GAUZE/BANDAGES/DRESSINGS) ×3 IMPLANT
GLOVE BIO SURGEON STRL SZ7 (GLOVE) ×1 IMPLANT
GLOVE BIOGEL PI IND STRL 7.5 (GLOVE) IMPLANT
GLOVE BIOGEL PI INDICATOR 7.5 (GLOVE) ×1
GLOVE SKINSENSE NS SZ7.5 (GLOVE) ×1
GLOVE SKINSENSE STRL SZ7.5 (GLOVE) ×1 IMPLANT
GLOVE SURG SYN 7.5  E (GLOVE) ×1
GLOVE SURG SYN 7.5 E (GLOVE) ×1 IMPLANT
GLOVE SURG SYN 7.5 PF PI (GLOVE) ×1 IMPLANT
GOWN STRL REIN XL XLG (GOWN DISPOSABLE) ×2 IMPLANT
GOWN STRL REUS W/ TWL LRG LVL3 (GOWN DISPOSABLE) ×1 IMPLANT
GOWN STRL REUS W/TWL LRG LVL3 (GOWN DISPOSABLE) ×2
NEEDLE HYPO 22GX1.5 SAFETY (NEEDLE) IMPLANT
NS IRRIG 1000ML POUR BTL (IV SOLUTION) ×2 IMPLANT
PACK BASIN DAY SURGERY FS (CUSTOM PROCEDURE TRAY) ×2 IMPLANT
PAD CAST 3X4 CTTN HI CHSV (CAST SUPPLIES) ×1 IMPLANT
PAD CAST 4YDX4 CTTN HI CHSV (CAST SUPPLIES) IMPLANT
PADDING CAST ABS 3INX4YD NS (CAST SUPPLIES)
PADDING CAST ABS COTTON 3X4 (CAST SUPPLIES) IMPLANT
PADDING CAST COTTON 3X4 STRL (CAST SUPPLIES) ×2
PADDING CAST COTTON 4X4 STRL (CAST SUPPLIES) ×2
PADDING CAST SYNTHETIC 3 NS LF (CAST SUPPLIES)
PADDING CAST SYNTHETIC 3X4 NS (CAST SUPPLIES) IMPLANT
PADDING CAST SYNTHETIC 4 (CAST SUPPLIES)
PADDING CAST SYNTHETIC 4X4 STR (CAST SUPPLIES) IMPLANT
PROS LCP PLATE 8H 111M (Plate) ×2 IMPLANT
PROSTHESIS LCP PLATE 8H 111M (Plate) IMPLANT
SCREW CORTEX 3.5 16MM (Screw) ×2 IMPLANT
SCREW CORTEX 3.5 18MM (Screw) ×5 IMPLANT
SCREW LOCK CORT ST 3.5X16 (Screw) IMPLANT
SCREW LOCK CORT ST 3.5X18 (Screw) IMPLANT
SLEEVE SCD COMPRESS KNEE MED (MISCELLANEOUS) ×2 IMPLANT
SPLINT FIBERGLASS 3X35 (CAST SUPPLIES) IMPLANT
SPLINT FIBERGLASS 4X30 (CAST SUPPLIES) ×1 IMPLANT
SPONGE GAUZE 4X4 12PLY STER LF (GAUZE/BANDAGES/DRESSINGS) ×1 IMPLANT
STOCKINETTE 4X48 STRL (DRAPES) ×2 IMPLANT
SUCTION FRAZIER HANDLE 10FR (MISCELLANEOUS) ×2
SUCTION TUBE FRAZIER 10FR DISP (MISCELLANEOUS) ×1 IMPLANT
SUT ETHILON 3 0 PS 1 (SUTURE) ×2 IMPLANT
SUT VIC AB 2-0 SH 27 (SUTURE)
SUT VIC AB 2-0 SH 27XBRD (SUTURE) ×1 IMPLANT
SUT VIC AB 3-0 SH 27 (SUTURE) ×2
SUT VIC AB 3-0 SH 27X BRD (SUTURE) IMPLANT
SYR BULB 3OZ (MISCELLANEOUS) ×2 IMPLANT
SYR CONTROL 10ML LL (SYRINGE) IMPLANT
SYSTEM CHEST DRAIN TLS 7FR (DRAIN) ×1 IMPLANT
TOWEL OR 17X24 6PK STRL BLUE (TOWEL DISPOSABLE) ×4 IMPLANT
TOWEL OR NON WOVEN STRL DISP B (DISPOSABLE) ×2 IMPLANT
TRAY DSU PREP LF (CUSTOM PROCEDURE TRAY) ×2 IMPLANT
TUBE CONNECTING 20X1/4 (TUBING) ×2 IMPLANT
UNDERPAD 30X30 (UNDERPADS AND DIAPERS) ×3 IMPLANT
YANKAUER SUCT BULB TIP NO VENT (SUCTIONS) IMPLANT

## 2015-09-24 NOTE — Anesthesia Postprocedure Evaluation (Signed)
Anesthesia Post Note  Patient: Cameron PilgrimDonald L Harrison  Procedure(s) Performed: Procedure(s) (LRB): OPEN REDUCTION INTERNAL FIXATION (ORIF) LEFT RADIAL SHAFT FRACTURE (Left) CLOSED REDUCTION PERCUTANEOUS PINNING LEFT DISTAL RADIOULNAR JOINT (Left)  Patient location during evaluation: PACU Anesthesia Type: General Level of consciousness: awake and alert Pain management: pain level controlled Vital Signs Assessment: post-procedure vital signs reviewed and stable Respiratory status: spontaneous breathing, nonlabored ventilation and respiratory function stable Cardiovascular status: blood pressure returned to baseline and stable Postop Assessment: no signs of nausea or vomiting Anesthetic complications: no    Last Vitals:  Filed Vitals:   09/24/15 1330 09/24/15 1400  BP: 115/68 133/81  Pulse: 76 70  Temp:  36.4 C  Resp: 10 16    Last Pain:  Filed Vitals:   09/24/15 1405  PainSc: 0-No pain                 Asaiah Hunnicutt A

## 2015-09-24 NOTE — Transfer of Care (Signed)
Immediate Anesthesia Transfer of Care Note  Patient: Corena Pilgrimonald L Calleros  Procedure(s) Performed: Procedure(s): OPEN REDUCTION INTERNAL FIXATION (ORIF) LEFT RADIAL SHAFT FRACTURE (Left) CLOSED REDUCTION PERCUTANEOUS PINNING LEFT DISTAL RADIOULNAR JOINT (Left)  Patient Location: PACU  Anesthesia Type:GA combined with regional for post-op pain  Level of Consciousness: sedated  Airway & Oxygen Therapy: Patient Spontanous Breathing and Patient connected to face mask oxygen  Post-op Assessment: Report given to RN and Post -op Vital signs reviewed and stable  Post vital signs: Reviewed and stable  Last Vitals:  Filed Vitals:   09/24/15 1055 09/24/15 1056  BP: 134/84   Pulse: 62 63  Temp:    Resp: 17 16    Last Pain:  Filed Vitals:   09/24/15 1057  PainSc: 4       Patients Stated Pain Goal: 2 (09/24/15 0932)  Complications: No apparent anesthesia complications

## 2015-09-24 NOTE — Progress Notes (Signed)
Assisted Dr. Crews with left, ultrasound guided, infraclavicular block. Side rails up, monitors on throughout procedure. See vital signs in flow sheet. Tolerated Procedure well. 

## 2015-09-24 NOTE — Op Note (Signed)
   Date of Surgery: 09/24/2015  INDICATIONS: [name] is a 70 y.o.-year-old male who sustained a forearm fracture; he was indicated for open reduction and internal fixation due to the displaced nature of the fracture and came to the operating room today for this procedure. The patient did consent to the procedure after discussion of the risks and benefits.   PREOPERATIVE DIAGNOSIS: left galeazzi fracture  POSTOPERATIVE DIAGNOSIS: Same.  PROCEDURE: Open reduction and internal fixation left radius shaft and closed reduction and percutaneous pinning of DRUJ  SURGEON: N. Glee ArvinMichael Xu, M.D.  ASSIST: none  ANESTHESIA: general, regional  IV FLUIDS AND URINE: See anesthesia.  ESTIMATED BLOOD LOSS: minimal mL.  IMPLANTS: Synthes small fragment plate and 3.5 mm cortical screws, 2 mm K wire  DRAINS: None.  COMPLICATIONS: None.  DESCRIPTION OF PROCEDURE: The patient was brought to the operating room and placed supine on the operating table.  The patient had been signed prior to the procedure. The patient had the anesthesia placed by the anesthesiologist.  The prep verification and incision time-outs were performed to confirm that this was the correct patient, site, side and location. The patient had SCDs in place on the lower extremities. The patient did receive antibiotics prior to the incision and was re-dosed during the procedure as needed at indicated intervals.  A well padded tourniquet was placed on the upper arm.  The upper extremity was prepped and draped in the standard fashion.  The bony landmarks were palpated and the incisions were drawn on the skin.  The tourniquet was inflated to 250 mmHg. A volar approach of Sherilyn CooterHenry was used to expose the radius. The interval between the FCR and radial artery was dissected down to the pronator quadratus distally. This was carried proximally and the FPL, pronator teres insertion were elevated off of the radial side of the radius gently, again protecting  the neurovascular structures and radial artery. The radial shaft fracture was first visualized and debrided of immature callus and organized hematoma. This was able to be reduced directly under direct visualization and the plate was placed in compression mode. The screws were all placed in a standard fashion: first drilling, then measuring with a depth gauge, then placing the screw on power and tightening by hand.   He had full pronation and supination after fixation.  I was able to restore the radial bow. Attention was then turned to the DRUJ. The DRUJ was reduced and then a single 2.0 mm K wire was placed from the distal ulna to the distal radius.  The final x-rays were taken in both AP and lateral views to confirm the reduction and screw lengths and locations. The wounds were copiously irrigated with normal saline.  The wound closed in a layer fashion using 3.0 vicryl suture and the skin was re-approximated with 4-0 nylon horizontal mattress sutures. The wounds were cleaned and dried a final time and a sterile dressing was placed. The patient was then placed in a short arm splint. The patient was then transferred back to the bed and left the operating room in stable condition.  All sponge and instrument counts were correct.  POSTOPERATIVE PLAN: [name] will remain non-weight bearing on this arm for approximately 6 weeks; he will return for suture removal 2 weeks. Elbow range of motion, including supination and pronation, should start immediately.

## 2015-09-24 NOTE — Anesthesia Procedure Notes (Addendum)
Anesthesia Regional Block:  Infraclavicular brachial plexus block  Pre-Anesthetic Checklist: ,, timeout performed, Correct Patient, Correct Site, Correct Laterality, Correct Procedure, Correct Position, site marked, Risks and benefits discussed,  Surgical consent,  Pre-op evaluation,  At surgeon's request and post-op pain management  Laterality: Left and Upper  Prep: chloraprep       Needles:  Injection technique: Single-shot  Needle Type: Echogenic Stimulator Needle     Needle Length: 5cm 5 cm Needle Gauge: 21 and 21 G    Additional Needles:  Procedures: ultrasound guided (picture in chart) Infraclavicular brachial plexus block Narrative:  Start time: 09/24/2015 10:54 AM End time: 09/24/2015 10:59 AM Injection made incrementally with aspirations every 5 mL.  Performed by: Personally  Anesthesiologist: CREWS, DAVID   Procedure Name: LMA Insertion Date/Time: 09/24/2015 11:16 AM Performed by: Gar GibbonKEETON, DENNIS S Pre-anesthesia Checklist: Patient identified, Emergency Drugs available, Suction available and Patient being monitored Patient Re-evaluated:Patient Re-evaluated prior to inductionOxygen Delivery Method: Circle system utilized Preoxygenation: Pre-oxygenation with 100% oxygen Intubation Type: IV induction Ventilation: Mask ventilation without difficulty LMA: LMA inserted LMA Size: 5.0 Number of attempts: 1 Airway Equipment and Method: Bite block Placement Confirmation: positive ETCO2 Tube secured with: Tape Dental Injury: Teeth and Oropharynx as per pre-operative assessment     Procedure Name: LMA Insertion Date/Time: 09/24/2015 11:16 AM Performed by: Burna CashONRAD, Hart Haas C Pre-anesthesia Checklist: Patient identified, Emergency Drugs available, Suction available and Patient being monitored Patient Re-evaluated:Patient Re-evaluated prior to inductionOxygen Delivery Method: Circle system utilized Preoxygenation: Pre-oxygenation with 100% oxygen Intubation Type: IV  induction Ventilation: Mask ventilation without difficulty LMA: LMA inserted LMA Size: 5.0 Number of attempts: 1 Airway Equipment and Method: Bite block Placement Confirmation: positive ETCO2 Tube secured with: Tape Dental Injury: Teeth and Oropharynx as per pre-operative assessment       Left Infraclavicular block image

## 2015-09-24 NOTE — Anesthesia Preprocedure Evaluation (Signed)
Anesthesia Evaluation  Patient identified by MRN, date of birth, ID band Patient awake    Reviewed: Allergy & Precautions, NPO status , Patient's Chart, lab work & pertinent test results  Airway Mallampati: I  TM Distance: >3 FB     Dental  (+) Missing, Teeth Intact, Dental Advisory Given   Pulmonary former smoker,    breath sounds clear to auscultation       Cardiovascular hypertension, Pt. on medications  Rhythm:Regular Rate:Normal     Neuro/Psych    GI/Hepatic   Endo/Other  diabetes, Well Controlled, Type 2, Oral Hypoglycemic Agents  Renal/GU      Musculoskeletal   Abdominal   Peds  Hematology   Anesthesia Other Findings   Reproductive/Obstetrics                             Anesthesia Physical Anesthesia Plan  ASA: III  Anesthesia Plan: General   Post-op Pain Management: GA combined w/ Regional for post-op pain   Induction: Intravenous  Airway Management Planned: LMA  Additional Equipment:   Intra-op Plan:   Post-operative Plan: Extubation in OR  Informed Consent: I have reviewed the patients History and Physical, chart, labs and discussed the procedure including the risks, benefits and alternatives for the proposed anesthesia with the patient or authorized representative who has indicated his/her understanding and acceptance.   Dental advisory given  Plan Discussed with: CRNA, Anesthesiologist and Surgeon  Anesthesia Plan Comments:         Anesthesia Quick Evaluation

## 2015-09-24 NOTE — Discharge Instructions (Signed)
Postoperative instructions: ° °Weightbearing: non weight bearing ° °Dressing instructions: Keep your dressing and/or splint clean and dry at all times.  It will be removed at your first post-operative appointment.  Your stitches and/or staples will be removed at this visit. ° °Incision instructions:  Do not soak your incision for 3 weeks after surgery.  If the incision gets wet, pat dry and do not scrub the incision. ° °Pain control:  You have been given a prescription to be taken as directed for post-operative pain control.  In addition, elevate the operative extremity above the heart at all times to prevent swelling and throbbing pain. ° °Take over-the-counter Colace, 100mg by mouth twice a day while taking narcotic pain medications to help prevent constipation. ° °Follow up appointments: °1) 10-14 days for suture removal and wound check. °2) Dr. Xu as scheduled. ° ° ------------------------------------------------------------------------------------------------------------- ° °After Surgery Pain Control: ° °After your surgery, post-surgical discomfort or pain is likely. This discomfort can last several days to a few weeks. At certain times of the day your discomfort may be more intense.  °Did you receive a nerve block?  °A nerve block can provide pain relief for one hour to two days after your surgery. As long as the nerve block is working, you will experience little or no sensation in the area the surgeon operated on.  °As the nerve block wears off, you will begin to experience pain or discomfort. It is very important that you begin taking your prescribed pain medication before the nerve block fully wears off. Treating your pain at the first sign of the block wearing off will ensure your pain is better controlled and more tolerable when full-sensation returns. Do not wait until the pain is intolerable, as the medicine will be less effective. It is better to treat pain in advance than to try and catch up.    °General Anesthesia:  °If you did not receive a nerve block during your surgery, you will need to start taking your pain medication shortly after your surgery and should continue to do so as prescribed by your surgeon.  °Pain Medication:  °Most commonly we prescribe Vicodin and Percocet for post-operative pain. Both of these medications contain a combination of acetaminophen (Tylenol®) and a narcotic to help control pain.  °· It takes between 30 and 45 minutes before pain medication starts to work. It is important to take your medication before your pain level gets too intense.  °· Nausea is a common side effect of many pain medications. You will want to eat something before taking your pain medicine to help prevent nausea.  °· If you are taking a prescription pain medication that contains acetaminophen, we recommend that you do not take additional over the counter acetaminophen (Tylenol®).  °Other pain relieving options:  °· Using a cold pack to ice the affected area a few times a day (15 to 20 minutes at a time) can help to relieve pain, reduce swelling and bruising.  °· Elevation of the affected area can also help to reduce pain and swelling. ° ° ° ° ° ° °Regional Anesthesia Blocks ° °1. Numbness or the inability to move the "blocked" extremity may last from 3-48 hours after placement. The length of time depends on the medication injected and your individual response to the medication. If the numbness is not going away after 48 hours, call your surgeon. ° °2. The extremity that is blocked will need to be protected until the numbness is gone and the    Strength has returned. Because you cannot feel it, you will need to take extra care to avoid injury. Because it may be weak, you may have difficulty moving it or using it. You may not know what position it is in without looking at it while the block is in effect. ° °3. For blocks in the legs and feet, returning to weight bearing and walking needs to be done  carefully. You will need to wait until the numbness is entirely gone and the strength has returned. You should be able to move your leg and foot normally before you try and bear weight or walk. You will need someone to be with you when you first try to ensure you do not fall and possibly risk injury. ° °4. Bruising and tenderness at the needle site are common side effects and will resolve in a few days. ° °5. Persistent numbness or new problems with movement should be communicated to the surgeon or the [facility] ([phone])/ [facility] ([phone]). ° ° ° ° ° °Post Anesthesia Home Care Instructions ° °Activity: °Get plenty of rest for the remainder of the day. A responsible adult should stay with you for 24 hours following the procedure.  °For the next 24 hours, DO NOT: °-Drive a car °-Operate machinery °-Drink alcoholic beverages °-Take any medication unless instructed by your physician °-Make any legal decisions or sign important papers. ° °Meals: °Start with liquid foods such as gelatin or soup. Progress to regular foods as tolerated. Avoid greasy, spicy, heavy foods. If nausea and/or vomiting occur, drink only clear liquids until the nausea and/or vomiting subsides. Call your physician if vomiting continues. ° °Special Instructions/Symptoms: °Your throat may feel dry or sore from the anesthesia or the breathing tube placed in your throat during surgery. If this causes discomfort, gargle with warm salt water. The discomfort should disappear within 24 hours. ° °If you had a scopolamine patch placed behind your ear for the management of post- operative nausea and/or vomiting: ° °1. The medication in the patch is effective for 72 hours, after which it should be removed.  Wrap patch in a tissue and discard in the trash. Wash hands thoroughly with soap and water. °2. You may remove the patch earlier than 72 hours if you experience unpleasant side effects which may include dry mouth,  dizziness or visual disturbances. °3. Avoid touching the patch. Wash your hands with soap and water after contact with the patch. °  ° ° °

## 2015-09-24 NOTE — H&P (Signed)
    PREOPERATIVE H&P  Chief Complaint: Left Galeazzi fracture/dislocation  HPI: Cameron PilgrimDonald L Gulino is a 70 y.o. male who presents for surgical treatment of Left Galeazzi fracture/dislocation.  He denies any changes in medical history.  Past Medical History  Diagnosis Date  . Hypertension   . Diabetes mellitus without complication (HCC)   . Galeazzi's fracture of left radius    Past Surgical History  Procedure Laterality Date  . Appendectomy    . Back surgery      ruptured disk  . Cholecystectomy N/A 03/25/2014    Procedure: LAPAROSCOPIC CHOLECYSTECTOMY WITH INTRAOPERATIVE CHOLANGIOGRAM;  Surgeon: Violeta GelinasBurke Thompson, MD;  [facility];  Service: General;  Laterality: N/A;  . Lysis of adhesion  03/25/2014    Procedure: Laparascopic LYSIS OF ADHESIONs ;  Surgeon: Violeta GelinasBurke Thompson, MD;  Location: [facility] OR;  Service: General;;   Social History   Social History  . Marital Status: Married    Spouse Name: N/A  . Number of Children: N/A  . Years of Education: N/A   Social History Main Topics  . Smoking status: Former Games developermoker  . Smokeless tobacco: Never Used     Comment: quit smoking in 1989  . Alcohol Use: No  . Drug Use: No  . Sexual Activity: Not Asked   Other Topics Concern  . None   Social History Narrative   History reviewed. No pertinent family history. Allergies  Allergen Reactions  . Oxycodone Itching   Prior to Admission medications   Medication Sig Start Date End Date Taking? Authorizing Provider  amLODipine (NORVASC) 10 MG tablet Take 10 mg by mouth daily.   Yes Historical Provider, MD  aspirin EC 81 MG tablet Take 81 mg by mouth daily.   Yes Historical Provider, MD  HYDROcodone-acetaminophen (NORCO/VICODIN) 5-325 MG tablet Take 1-2 tablets by mouth every 6 (six) hours as needed. 09/04/15  Yes Roxy Horsemanobert Browning, PA-C  lisinopril (PRINIVIL,ZESTRIL) 5 MG tablet Take 5 mg by mouth daily.   Yes Historical Provider, MD  metFORMIN (GLUCOPHAGE) 500 MG tablet Take 500 mg by  mouth daily with breakfast.   Yes Historical Provider, MD  pravastatin (PRAVACHOL) 20 MG tablet Take 20 mg by mouth daily.   Yes Historical Provider, MD     Positive ROS: All other systems have been reviewed and were otherwise negative with the exception of those mentioned in the HPI and as above.  Physical Exam: General: Alert, no acute distress Cardiovascular: No pedal edema Respiratory: No cyanosis, no use of accessory musculature GI: abdomen soft Skin: No lesions in the area of chief complaint Neurologic: Sensation intact distally Psychiatric: Patient is competent for consent with normal mood and affect Lymphatic: no lymphedema  MUSCULOSKELETAL: exam stable  Assessment: Left Galeazzi fracture/dislocation  Plan: Plan for Procedure(s): OPEN REDUCTION INTERNAL FIXATION (ORIF) LEFT RADIAL SHAFT FRACTURE, CLOSED REDUCTION PERCUTANEOUS PINNING LEFT DISTAL RADIOULNAR JOINT  The risks benefits and alternatives were discussed with the patient including but not limited to the risks of nonoperative treatment, versus surgical intervention including infection, bleeding, nerve injury,  blood clots, cardiopulmonary complications, morbidity, mortality, among others, and they were willing to proceed.   Cheral AlmasXu, Daisa Stennis Michael, MD   09/24/2015 10:55 AM

## 2015-09-26 ENCOUNTER — Encounter (HOSPITAL_BASED_OUTPATIENT_CLINIC_OR_DEPARTMENT_OTHER): Payer: Self-pay | Admitting: Orthopaedic Surgery

## 2016-01-19 ENCOUNTER — Ambulatory Visit (INDEPENDENT_AMBULATORY_CARE_PROVIDER_SITE_OTHER): Payer: Self-pay | Admitting: Orthopaedic Surgery

## 2016-01-20 ENCOUNTER — Ambulatory Visit (INDEPENDENT_AMBULATORY_CARE_PROVIDER_SITE_OTHER): Payer: Self-pay | Admitting: Orthopaedic Surgery

## 2016-01-26 ENCOUNTER — Ambulatory Visit (INDEPENDENT_AMBULATORY_CARE_PROVIDER_SITE_OTHER): Payer: Self-pay

## 2016-01-26 ENCOUNTER — Ambulatory Visit (INDEPENDENT_AMBULATORY_CARE_PROVIDER_SITE_OTHER): Payer: Worker's Compensation | Admitting: Orthopaedic Surgery

## 2016-01-26 ENCOUNTER — Encounter (INDEPENDENT_AMBULATORY_CARE_PROVIDER_SITE_OTHER): Payer: Self-pay | Admitting: Orthopaedic Surgery

## 2016-01-26 DIAGNOSIS — S52372D Galeazzi's fracture of left radius, subsequent encounter for closed fracture with routine healing: Secondary | ICD-10-CM

## 2016-01-26 NOTE — Progress Notes (Signed)
   Office Visit Note   Patient: Cameron Harrison           Date of Birth: 01/12/1946           MRN: [id number] Visit Date: 01/26/2016              Requested by: No referring provider defined for this encounter. PCP: Shelby MattocksAdam Bostick, MD, MD   Assessment & Plan: Visit Diagnoses:  1. Closed Galeazzi's fracture of left radius with routine healing     Plan:  - he is doing well, has reached MMI - release to full duty - f/u prn  Follow-Up Instructions: Return if symptoms worsen or fail to improve.   Orders:  Orders Placed This Encounter  Procedures  . XR Forearm Left   No orders of the defined types were placed in this encounter.     Procedures: No procedures performed   Clinical Data: No additional findings.   Subjective: Chief Complaint  Patient presents with  . Left Forearm - Fracture, Follow-up, Injury    09/24/15-ORIF LEFT GALEAZZI FX    4 month f/u visit for L galeazzi fx dislocation.  Doing well.  Back to work.  No pain.    Review of Systems   Objective: Vital Signs: There were no vitals taken for this visit.  Physical Exam  Nursing note and vitals reviewed.   Left Hand Exam   Range of Motion   Wrist  Extension: normal  Flexion: normal  Pronation: 80  Supination: normal   Muscle Strength  The patient has normal left wrist strength.      Specialty Comments:  No specialty comments available.  Imaging: Xr Forearm Left  Result Date: 01/26/2016 Healed radius fracture, DRUJ stable    PMFS History: Patient Active Problem List   Diagnosis Date Noted  . Cholelithiasis and acute cholecystitis with obstruction 03/26/2014  . Choledocholithiasis 03/26/2014  . Cholecystitis 03/25/2014   Past Medical History:  Diagnosis Date  . Diabetes mellitus without complication (HCC)   . Galeazzi's fracture of left radius   . Hypertension     History reviewed. No pertinent family history.  Past Surgical History:  Procedure Laterality Date  .  APPENDECTOMY    . BACK SURGERY     ruptured disk  . CHOLECYSTECTOMY N/A 03/25/2014   Procedure: LAPAROSCOPIC CHOLECYSTECTOMY WITH INTRAOPERATIVE CHOLANGIOGRAM;  Surgeon: Violeta GelinasBurke Thompson, MD;  [facility];  Service: General;  Laterality: N/A;  . LYSIS OF ADHESION  03/25/2014   Procedure: Laparascopic LYSIS OF ADHESIONs ;  Surgeon: Violeta GelinasBurke Thompson, MD;  Location: [facility] OR;  Service: General;;  . OPEN REDUCTION INTERNAL FIXATION (ORIF) DISTAL RADIAL FRACTURE Left 09/24/2015   Procedure: OPEN REDUCTION INTERNAL FIXATION (ORIF) LEFT RADIAL SHAFT FRACTURE;  Surgeon: Tarry KosNaiping M Porcha Deblanc, MD;  Location: [facility];  Service: Orthopedics;  Laterality: Left;  . PERCUTANEOUS PINNING Left 09/24/2015   Procedure: CLOSED REDUCTION PERCUTANEOUS PINNING LEFT DISTAL RADIOULNAR JOINT;  Surgeon: Tarry KosNaiping M Chey Cho, MD;  Location: [facility];  Service: Orthopedics;  Laterality: Left;   Social History   Occupational History  . Not on file.   Social History Main Topics  . Smoking status: Former Games developermoker  . Smokeless tobacco: Never Used     Comment: quit smoking in 1989  . Alcohol use No  . Drug use: No  . Sexual activity: Not on file

## 2016-01-30 IMAGING — CR DG CHEST 1V PORT
1 series · 1 of 1 positions shown · non-contrast
Comparison: None.

CLINICAL DATA: Epigastric and chest pain, vomiting.

EXAM:
PORTABLE CHEST - 1 VIEW

[AP]
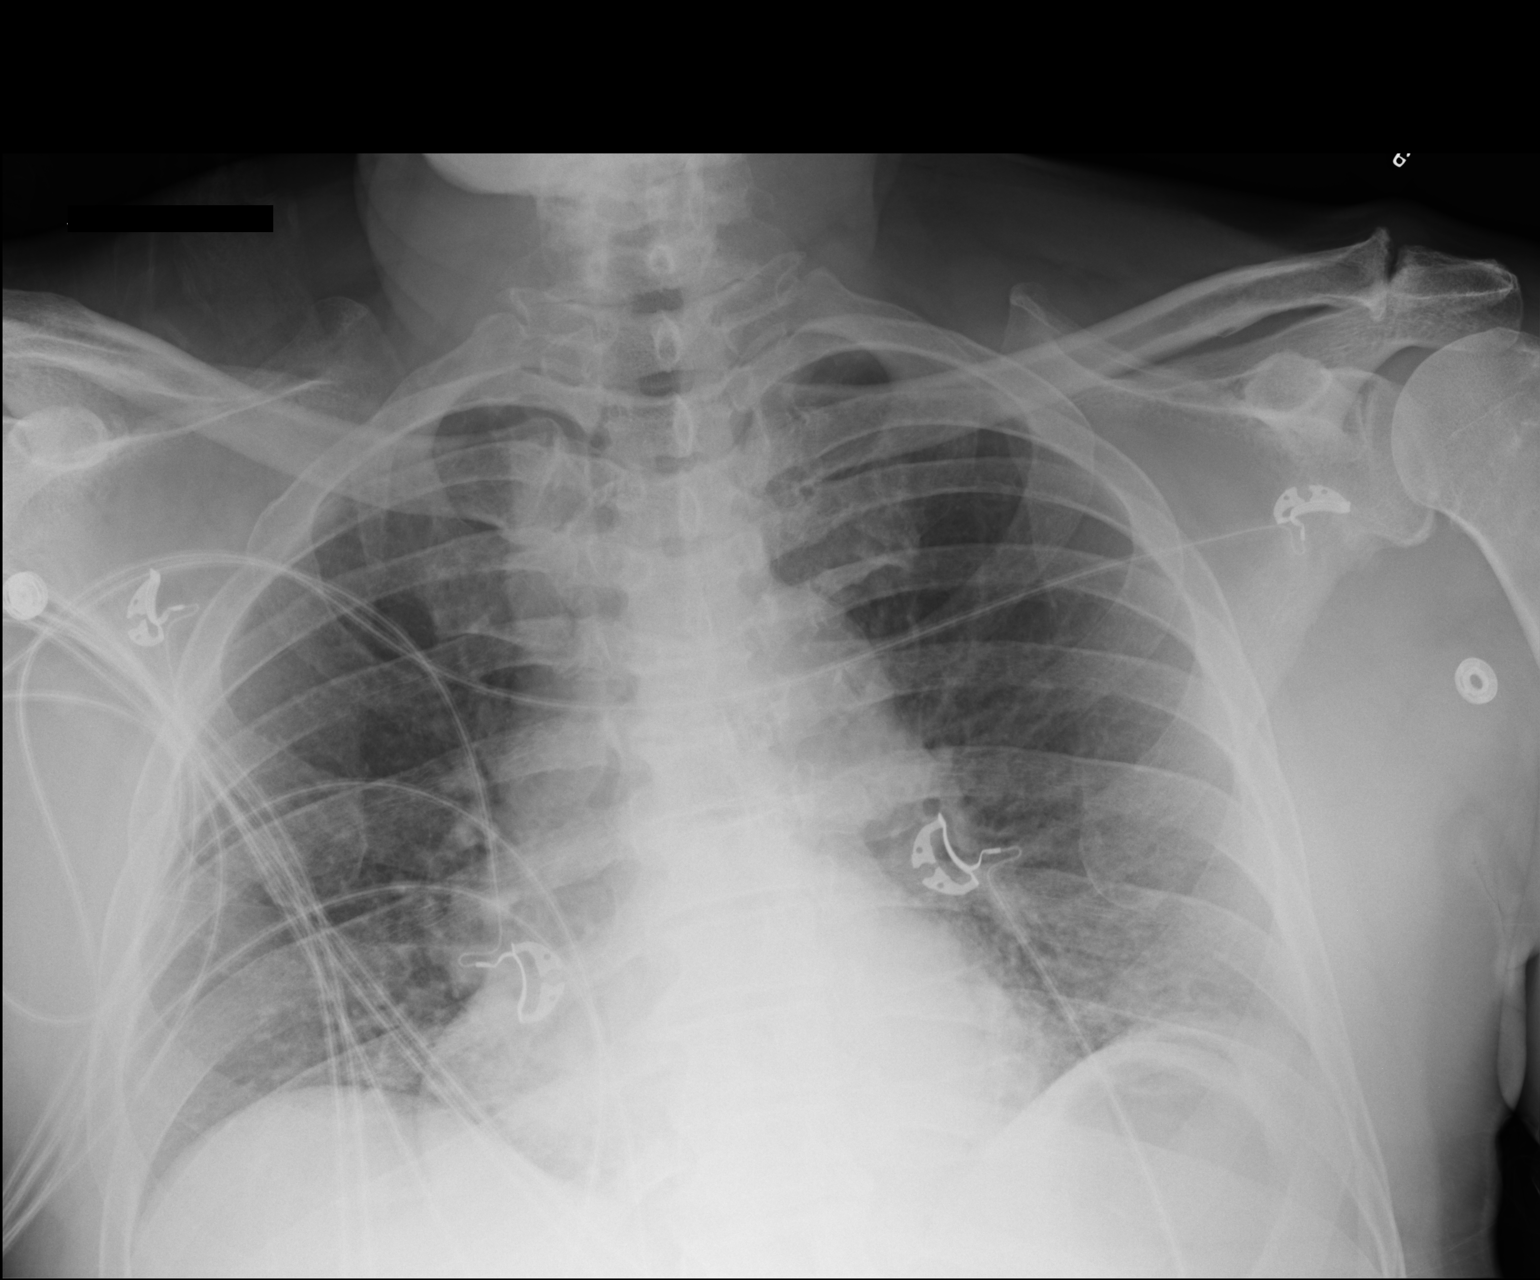

[1 of 1 positions shown; findings below may reference images not displayed]

FINDINGS: Heart is borderline in size. Bibasilar opacities likely reflect
atelectasis. No effusions. No acute bony abnormality.
IMPRESSION: Bibasilar opacities likely reflect atelectasis.

## 2016-01-30 IMAGING — CT CT ABD-PELV W/ CM
2 of 5 series · 16 of 46 positions shown, 18 images · IV contrast (omnipaque)
Comparison: None currently available.

CLINICAL DATA: Abdominal pain 6 hours after eating

EXAM:
CT ABDOMEN AND PELVIS WITH CONTRAST
TECHNIQUE: Multidetector CT imaging of the abdomen and pelvis was performed
using the standard protocol following bolus administration of
intravenous contrast.
CONTRAST:  100mL OMNIPAQUE IOHEXOL 300 MG/ML  SOLN

[Series 2: abd/ pelvis 5.0 i30f 1 · axial · 0.76mm/px · z∈[-285,+170]mm · 13 of 103 slices shown, 15 images]
[im 6/103  soft-tissue]
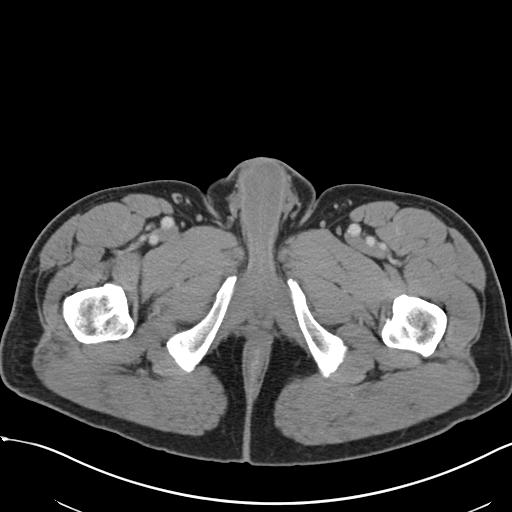
[im 6/103  bone]
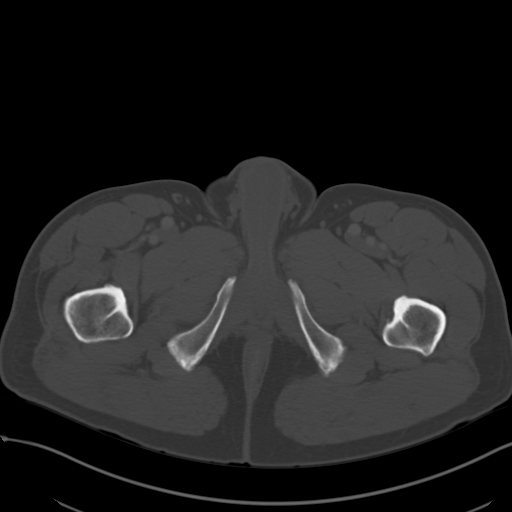
[im 16/103  soft-tissue]
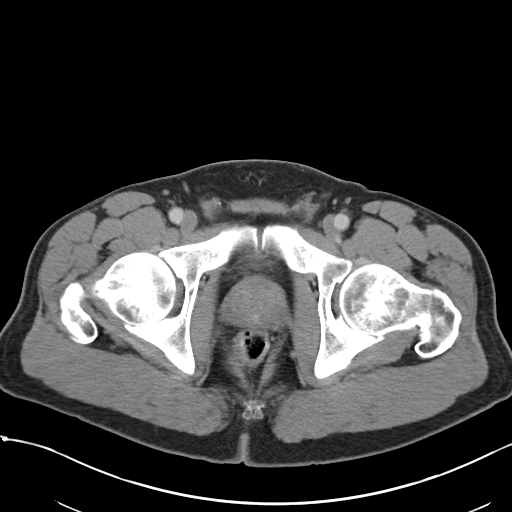
[im 21/103  soft-tissue]
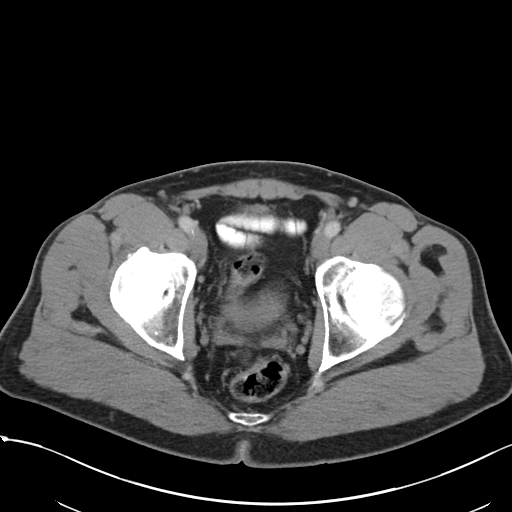
[im 31/103  soft-tissue]
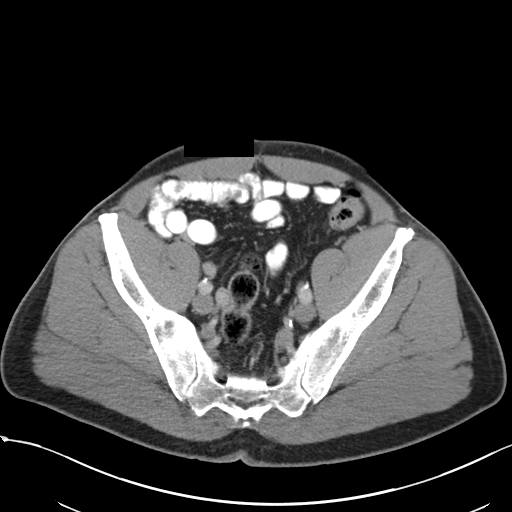
[im 36/103  soft-tissue]
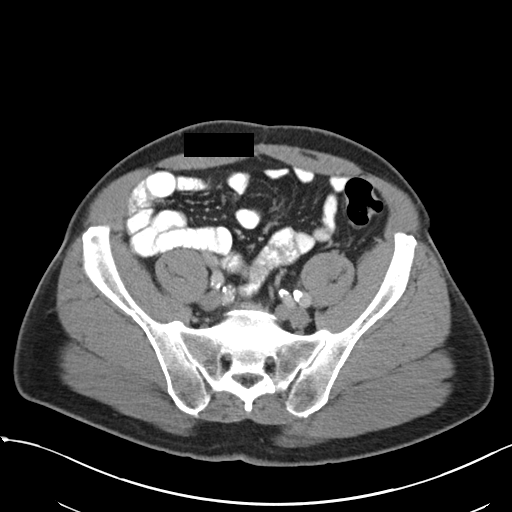
[im 46/103  soft-tissue]
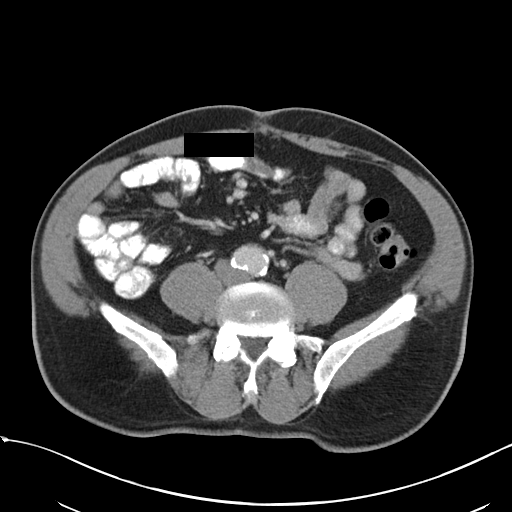
[im 52/103  soft-tissue]
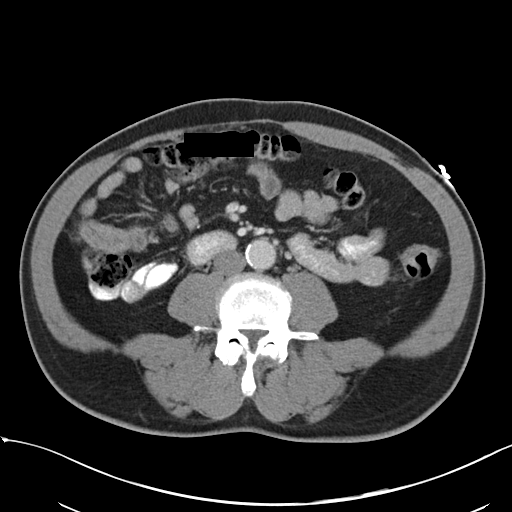
[im 57/103  soft-tissue]
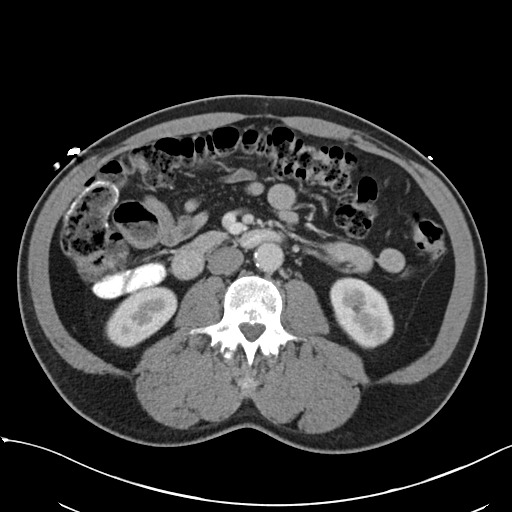
[im 67/103  soft-tissue]
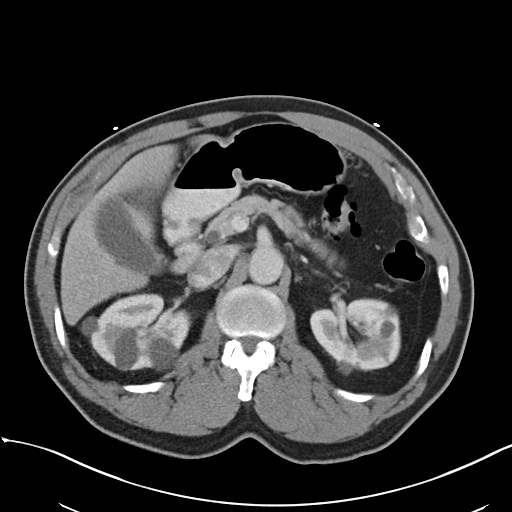
[im 67/103  bone]
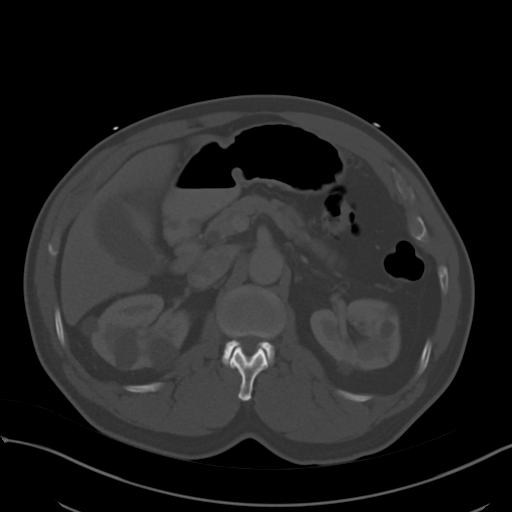
[im 72/103  soft-tissue]
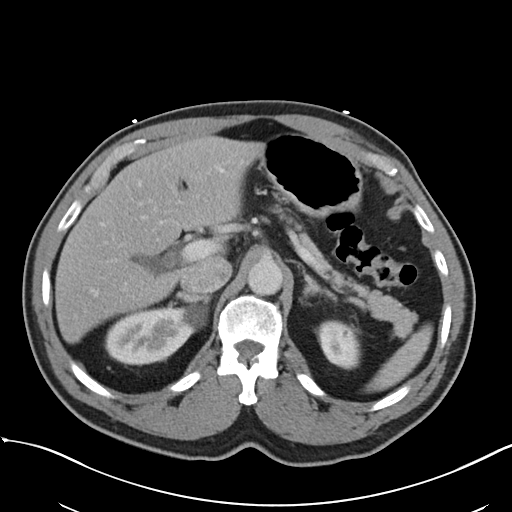
[im 82/103  soft-tissue]
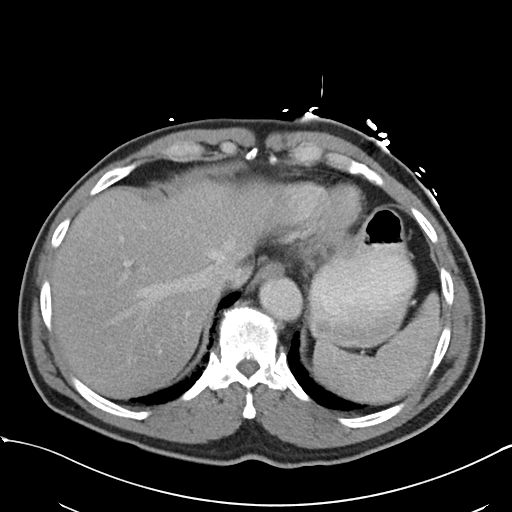
[im 87/103  soft-tissue]
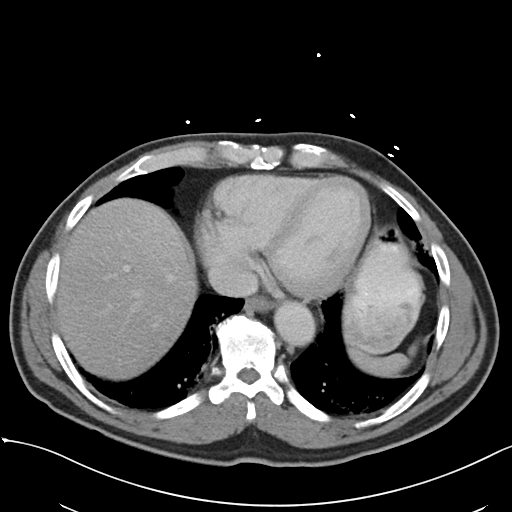
[im 97/103  soft-tissue]
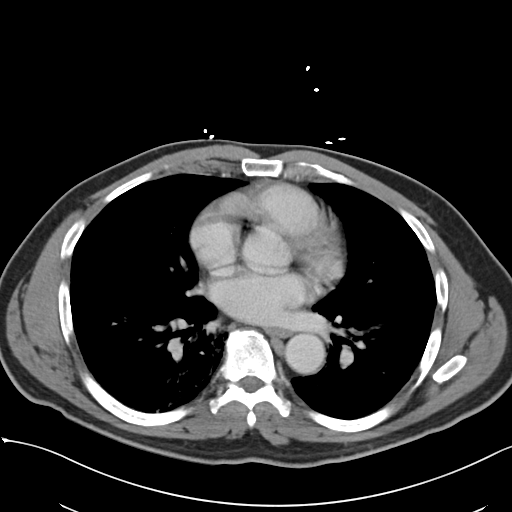

[Series 5: coronals · coronal · 0.73mm/px · 3 of 131 slices shown]
[im 44/131  soft-tissue]
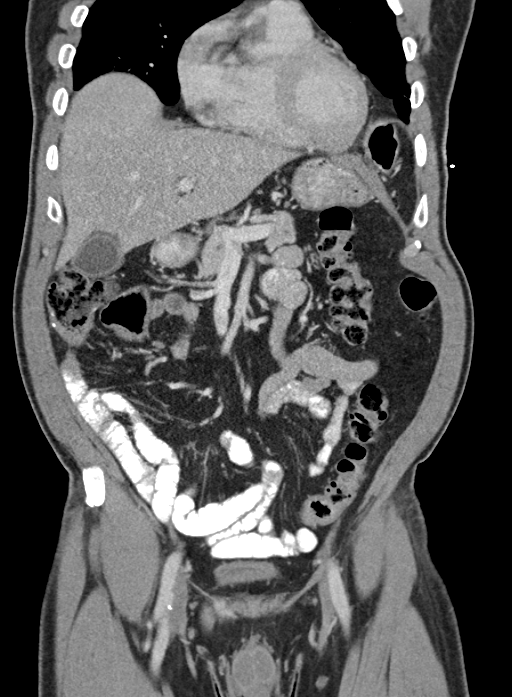
[im 58/131  soft-tissue]
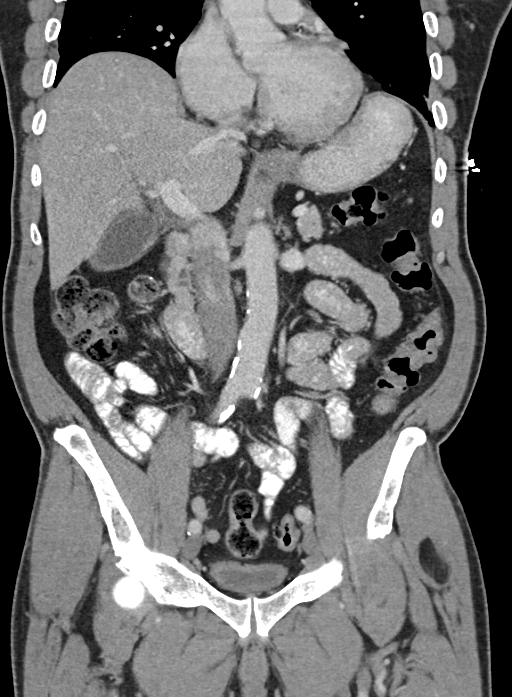
[im 73/131  soft-tissue]
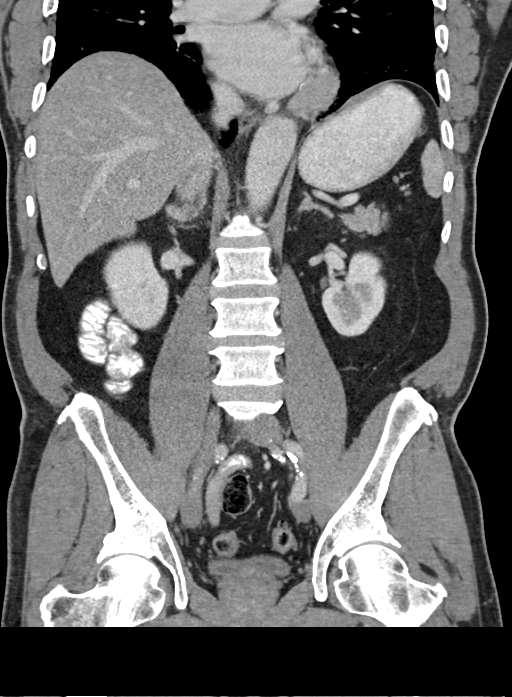

[16 of 46 positions shown; findings below may reference images not displayed]

FINDINGS: BODY WALL: Unremarkable.

LOWER CHEST: Coronary atherosclerosis. There is mild dependent
atelectasis. Reticular nodular densities in the inferior lingula
favor scarring given the history.

ABDOMEN/PELVIS:

Liver: No focal abnormality.

Biliary: There is mild gallbladder wall thickening or
pericholecystic edema. No calcified gallstone. No evidence for
choledocholithiasis.

Pancreas: Unremarkable.

Spleen: Unremarkable.

Adrenals: Unremarkable.

Kidneys and ureters: Numerous bilateral renal cysts. No
hydronephrosis or ureteral calculus.

Bladder: Unremarkable.

Reproductive: Multi nodular prostate, mildly deforming the bladder
base.

Bowel: No bowel obstruction. There are a few scattered colonic
diverticula. Ileocolic resection, presumably for appendicitis
reported 07/23/2002.

Retroperitoneum: No mass or adenopathy.

Peritoneum: No ascites or pneumoperitoneum.

Vascular: No acute abnormality.

OSSEOUS: Mid and lower lumbar facet arthropathy with bilateral
foraminal stenosis. There has been a L5 laminotomy on the right.
IMPRESSION: Mild gallbladder wall thickening or pericholecystic edema. No
calcified cholelithiasis, consider follow-up right upper quadrant
ultrasound.

## 2016-02-24 ENCOUNTER — Telehealth (INDEPENDENT_AMBULATORY_CARE_PROVIDER_SITE_OTHER): Payer: Self-pay | Admitting: *Deleted

## 2016-02-24 NOTE — Telephone Encounter (Signed)
Cameron JunesBrandon called from [employer] and needs last office notes, fax:  [phone]

## 2016-02-25 NOTE — Telephone Encounter (Signed)
Faxed to [phone]

## 2019-04-20 ENCOUNTER — Ambulatory Visit: Payer: Medicare HMO | Attending: Internal Medicine

## 2019-04-20 DIAGNOSIS — Z23 Encounter for immunization: Secondary | ICD-10-CM

## 2019-04-20 NOTE — Progress Notes (Signed)
   Covid-19 Vaccination Clinic  Name:  Cameron Harrison    MRN: [id number] DOB: Jun 20, 1945  04/20/2019  [name] was observed post Covid-19 immunization for 15 minutes without incidence. He was provided with Vaccine Information Sheet and instruction to access the V-Safe system.   [name] was instructed to call 911 with any severe reactions post vaccine: Marland Kitchen Difficulty breathing  . Swelling of your face and throat  . A fast heartbeat  . A bad rash all over your body  . Dizziness and weakness    Immunizations Administered    Name Date Dose VIS Date Route   Pfizer COVID-19 Vaccine 04/20/2019 10:10 AM 0.3 mL 03/09/2019 Intramuscular   Manufacturer: [employer], [employer]   Lot: [id number]   NDC: [id number]

## 2019-05-11 ENCOUNTER — Ambulatory Visit: Payer: Medicare HMO | Attending: Internal Medicine

## 2019-05-11 DIAGNOSIS — Z23 Encounter for immunization: Secondary | ICD-10-CM

## 2019-05-11 NOTE — Progress Notes (Signed)
   Covid-19 Vaccination Clinic  Name:  LEVANTE SIMONES    MRN: [id number] DOB: Sep 25, 1945  05/11/2019  Mr. Gallon was observed post Covid-19 immunization for 15 minutes without incidence. He was provided with Vaccine Information Sheet and instruction to access the V-Safe system.   Mr. Deisher was instructed to call 911 with any severe reactions post vaccine: Marland Kitchen Difficulty breathing  . Swelling of your face and throat  . A fast heartbeat  . A bad rash all over your body  . Dizziness and weakness    Immunizations Administered    Name Date Dose VIS Date Route   Pfizer COVID-19 Vaccine 05/11/2019 11:30 AM 0.3 mL 03/09/2019 Intramuscular   Manufacturer: [employer], [employer]   Lot: [id number]   NDC: [address]-5

## 2020-01-09 ENCOUNTER — Ambulatory Visit (INDEPENDENT_AMBULATORY_CARE_PROVIDER_SITE_OTHER): Payer: Medicare HMO | Admitting: Otolaryngology

## 2020-09-19 ENCOUNTER — Encounter: Payer: Self-pay | Admitting: Physician Assistant

## 2020-10-17 ENCOUNTER — Encounter: Payer: Self-pay | Admitting: Physician Assistant

## 2020-10-17 ENCOUNTER — Ambulatory Visit (INDEPENDENT_AMBULATORY_CARE_PROVIDER_SITE_OTHER): Payer: Medicare HMO | Admitting: Physician Assistant

## 2020-10-17 VITALS — BP 120/70 | HR 62 | Ht 72.0 in | Wt 205.8 lb

## 2020-10-17 DIAGNOSIS — K625 Hemorrhage of anus and rectum: Secondary | ICD-10-CM | POA: Diagnosis not present

## 2020-10-17 DIAGNOSIS — K6289 Other specified diseases of anus and rectum: Secondary | ICD-10-CM | POA: Diagnosis not present

## 2020-10-17 DIAGNOSIS — R194 Change in bowel habit: Secondary | ICD-10-CM | POA: Diagnosis not present

## 2020-10-17 DIAGNOSIS — K602 Anal fissure, unspecified: Secondary | ICD-10-CM | POA: Diagnosis not present

## 2020-10-17 MED ORDER — NA SULFATE-K SULFATE-MG SULF 17.5-3.13-1.6 GM/177ML PO SOLN: 1.0000 | Freq: Once | ORAL | 0 refills | Status: AC

## 2020-10-17 NOTE — Progress Notes (Signed)
Chief Complaint: Rectal bleeding  HPI:    [name] is a 75 year old African-American male with a past medical history as listed below including diabetes, known to Dr. Leone Payor, who was referred to me by Shelby Mattocks, MD for a complaint of rectal bleeding.      02/05/2005 colonoscopy with internal hemorrhoids and otherwise normal.  Repeat recommended in 10 years.    Today, the patient presents to clinic and tells me that he is having trouble with his "hemorrhoids" over the past 3 months.  Tells me he has also had an abrupt change in bowel habits towards constipation.  He will typically have just small balls of stool that he has to strain for and not even every day.  Very occasionally in between this he may have a normal stool.  He has tried Preparation H which seems to ease the "burning and itching", but he continues with blood when he wipes on the toilet paper.  He has not seen any without a stool.  Also discusses that is very hard to pass a stool and painful and the pain seems to linger.  He is worried as this is a big change for him.  Also tried some prescription Hydrocortisone suppositories which "did not help at all".    Takes care of his 70-year-old granddaughter.  He still works in concrete on a daily basis.    Denies fever, chills, weight loss, abdominal pain, nausea or vomiting.  Past Medical History:  Diagnosis Date   Diabetes mellitus without complication (HCC)    Galeazzi's fracture of left radius    Hypertension     Past Surgical History:  Procedure Laterality Date   APPENDECTOMY     BACK SURGERY     ruptured disk   CHOLECYSTECTOMY N/A 03/25/2014   Procedure: LAPAROSCOPIC CHOLECYSTECTOMY WITH INTRAOPERATIVE CHOLANGIOGRAM;  Surgeon: Violeta Gelinas, MD;  Location: [facility] OR;  Service: General;  Laterality: N/A;   LYSIS OF ADHESION  03/25/2014   Procedure: Laparascopic LYSIS OF ADHESIONs ;  Surgeon: Violeta Gelinas, MD;  Location: [facility] OR;  Service: General;;   OPEN REDUCTION  INTERNAL FIXATION (ORIF) DISTAL RADIAL FRACTURE Left 09/24/2015   Procedure: OPEN REDUCTION INTERNAL FIXATION (ORIF) LEFT RADIAL SHAFT FRACTURE;  Surgeon: Tarry Kos, MD;  Location: [facility];  Service: Orthopedics;  Laterality: Left;   PERCUTANEOUS PINNING Left 09/24/2015   Procedure: CLOSED REDUCTION PERCUTANEOUS PINNING LEFT DISTAL RADIOULNAR JOINT;  Surgeon: Tarry Kos, MD;  Location: [facility];  Service: Orthopedics;  Laterality: Left;    Current Outpatient Medications  Medication Sig Dispense Refill   amLODipine (NORVASC) 10 MG tablet Take 10 mg by mouth daily.     aspirin EC 81 MG tablet Take 81 mg by mouth daily.     Lidocaine-Glycerin (PREPARATION H EX) Apply 1 application topically 3 (three) times daily.     lisinopril (PRINIVIL,ZESTRIL) 5 MG tablet Take 5 mg by mouth daily.     metFORMIN (GLUCOPHAGE) 500 MG tablet Take 500 mg by mouth daily with breakfast.     pravastatin (PRAVACHOL) 20 MG tablet Take 20 mg by mouth daily.     No current facility-administered medications for this visit.    Allergies as of 10/17/2020 - Review Complete 10/17/2020  Allergen Reaction Noted   Oxycodone Itching 09/04/2015    Family History  Problem Relation Age of Onset   Colon cancer Neg Hx    Stomach cancer Neg Hx    Liver disease Neg Hx  Esophageal cancer Neg Hx    Pancreatic cancer Neg Hx     Social History   Socioeconomic History   Marital status: Married    Spouse name: Not on file   Number of children: Not on file   Years of education: Not on file   Highest education level: Not on file  Occupational History   Not on file  Tobacco Use   Smoking status: Former    Passive exposure: Never   Smokeless tobacco: Never   Tobacco comments:    quit smoking in 1989  Vaping Use   Vaping Use: Not on file  Substance and Sexual Activity   Alcohol use: No   Drug use: No   Sexual activity: Not Currently  Other Topics Concern   Not on file  Social  History Narrative   Not on file   Social Determinants of Health   Financial Resource Strain: Not on file  Food Insecurity: Not on file  Transportation Needs: Not on file  Physical Activity: Not on file  Stress: Not on file  Social Connections: Not on file  Intimate Partner Violence: Not on file    Review of Systems:    Constitutional: No weight loss, fever or chills Skin: No rash  Cardiovascular: No chest pain Respiratory: No SOB  Gastrointestinal: See HPI and otherwise negative Genitourinary: No dysuria Neurological: No headache, dizziness or syncope Musculoskeletal: No new muscle or joint pain Hematologic: No bruising Psychiatric: No history of depression or anxiety   Physical Exam:  Vital signs: BP 120/70   Pulse 62   Ht 6' (1.829 m)   Wt 205 lb 12.8 oz (93.4 kg)   SpO2 98%   BMI 27.91 kg/m   Constitutional:   Pleasant AA male appears to be in NAD, Well developed, Well nourished, alert and cooperative Head:  Normocephalic and atraumatic. Eyes:   PEERL, EOMI. No icterus. Conjunctiva pink. Ears:  Normal auditory acuity. Neck:  Supple Throat: Oral cavity and pharynx without inflammation, swelling or lesion.  Respiratory: Respirations even and unlabored. Lungs clear to auscultation bilaterally.   No wheezes, crackles, or rhonchi.  Cardiovascular: Normal S1, S2. No MRG. Regular rate and rhythm. No peripheral edema, cyanosis or pallor.  Gastrointestinal:  Soft, nondistended, nontender. No rebound or guarding. Normal bowel sounds. No appreciable masses or hepatomegaly. Rectal: External: Posterior fissure with active bleeding, also erosions around the rest of visible rectal tissue; internal deferred due to tenderness Msk:  Symmetrical without gross deformities. Without edema, no deformity or joint abnormality.  Neurologic:  Alert and  oriented x4;  grossly normal neurologically.  Skin:   Dry and intact without significant lesions or rashes. Psychiatric:  Demonstrates good  judgement and reason without abnormal affect or behaviors.  No recent labs or imaging.  Assessment: 1.  Rectal bleeding/rectal pain: Likely with anal fissure below, but also some question of proctitis on exam 2.  Anal fissure: Seen at time of exam today with rectal pain and rectal bleeding 3.  Change in bowel habits: Towards constipation over the past 3 months; consider stricturing versus mass versus polyps versus other  Plan: 1.  Discussed with patient that due to abrupt change in bowel habits and rectal exam today with visible erosions around rectal tissue, there is some question about proctitis versus underlying IBD.  Recommend the patient have another colonoscopy for further investigation.  This is scheduled with Dr. Leone Payor.  Did provide the patient a detailed list of risks for the procedure and he agrees to  proceed. 2.  Patient tells me Hydrocortisone suppositories only made things worse.  For now we will treat the fissure with Diltiazem 3 times daily x6-8 weeks. 3.  Recommend the patient buy over-the-counter RectiCare cream with lidocaine and apply as needed for pain 4.  Recommend sitz bath's for 15 to 20 minutes 2-3 times a day 5.  Recommend the patient take 2 Colace in the morning to help with softening stools 6.  Patient to follow in clinic per recommendations from Dr. Leone Payor after time of procedure.  Hyacinth Meeker, PA-C [facility] 10/17/2020, 8:37 AM  Cc: Shelby Mattocks, MD

## 2020-10-17 NOTE — Patient Instructions (Addendum)
We have sent a prescription for Diltiazem 2% gel to [facility] for you. Using your index finger, you should apply a small amount of medication inside the rectum up to your first knuckle/joint three times daily x 6-8 weeks.  [facility]'s information is below: Address: [address] [place], [place] [address]  [phone]  *Please DO NOT go directly from our office to pick up this medication! Give the pharmacy 1 day to process the prescription as this is compounded and takes time to make.  May use Reticare w/Lidocaine as needed for pain.   Take 2 Colace tablets daily in the morning.  You have been scheduled for a colonoscopy. Please follow written instructions given to you at your visit today.  Please pick up your prep supplies at the pharmacy within the next 1-3 days. If you use inhalers (even only as needed), please bring them with you on the day of your procedure.  If you are age 75 or older, your body mass index should be between 23-30. Your Body mass index is 27.91 kg/m. If this is out of the aforementioned range listed, please consider follow up with your Primary Care Provider.  If you are age 53 or younger, your body mass index should be between 19-25. Your Body mass index is 27.91 kg/m. If this is out of the aformentioned range listed, please consider follow up with your Primary Care Provider.   __________________________________________________________  The [facility] providers would like to encourage you to use [facility] to communicate with providers for non-urgent requests or questions.  Due to long hold times on the telephone, sending your provider a message by [facility] may be a faster and more efficient way to get a response.  Please allow 48 business hours for a response.  Please remember that this is for non-urgent requests.

## 2020-10-30 ENCOUNTER — Other Ambulatory Visit: Payer: Self-pay

## 2020-10-30 ENCOUNTER — Ambulatory Visit: Payer: Medicare HMO | Admitting: Internal Medicine

## 2020-10-30 ENCOUNTER — Encounter: Payer: Self-pay | Admitting: Internal Medicine

## 2020-10-30 VITALS — BP 116/70 | HR 60 | Temp 98.4°F | Resp 17 | Ht 72.0 in | Wt 205.0 lb

## 2020-10-30 DIAGNOSIS — K625 Hemorrhage of anus and rectum: Secondary | ICD-10-CM

## 2020-10-30 MED ORDER — HYDROCORTISONE (PERIANAL) 1 % EX CREA: 1.0000 "application " | TOPICAL_CREAM | Freq: Every evening | CUTANEOUS | 1 refills | Status: AC | PRN

## 2020-10-30 MED ORDER — SODIUM CHLORIDE 0.9 % IV SOLN
500.0000 mL | Freq: Once | INTRAVENOUS | Status: DC
Start: 2020-10-30 — End: 2020-10-30

## 2020-10-30 NOTE — Op Note (Signed)
[facility] Patient Name: Cameron PummelDonald Fendley Procedure Date: 10/30/2020 1:17 PM MRN: [id number] Endoscopist: Iva Booparl E Taelor Moncada , MD Age: 75 Referring MD:  Date of Birth: 12/02/1945 Gender: Male Account #: [id number] Procedure:                Colonoscopy Indications:              Rectal bleeding, Change in bowel habits Medicines:                Propofol per Anesthesia, Monitored Anesthesia Care Procedure:                Pre-Anesthesia Assessment:                           - Prior to the procedure, a History and Physical                            was performed, and patient medications and                            allergies were reviewed. The patient's tolerance of                            previous anesthesia was also reviewed. The risks                            and benefits of the procedure and the sedation                            options and risks were discussed with the patient.                            All questions were answered, and informed consent                            was obtained. Prior Anticoagulants: The patient has                            taken no previous anticoagulant or antiplatelet                            agents. ASA Grade Assessment: II - A patient with                            mild systemic disease. After reviewing the risks                            and benefits, the patient was deemed in                            satisfactory condition to undergo the procedure.                           After obtaining informed consent, the colonoscope  was passed under direct vision. Throughout the                            procedure, the patient's blood pressure, pulse, and                            oxygen saturations were monitored continuously. The                            Olympus CF-HQ190L [id number]) Colonoscope was                            introduced through the anus and advanced to the the                             ileocolonic anastomosis. The colonoscopy was                            performed without difficulty. The patient tolerated                            the procedure well. The quality of the bowel                            preparation was excellent. The rectum and                            Ileocolonic anastomsis areas were photographed. Scope In: 1:39:20 PM Scope Out: 1:51:19 PM Scope Withdrawal Time: 0 hours 8 minutes 2 seconds  Total Procedure Duration: 0 hours 11 minutes 59 seconds  Findings:                 The perianal and digital rectal examinations were                            normal.                           There was evidence of a prior end-to-side                            ileo-colonic anastomosis in the transverse colon.                            This was patent and was characterized by healthy                            appearing mucosa. The anastomosis was traversed.                           Multiple small-mouthed diverticula were found in                            the sigmoid colon.  External and internal hemorrhoids were found.                           The exam was otherwise without abnormality on                            direct and retroflexion views. Complications:            No immediate complications. Estimated Blood Loss:     Estimated blood loss: none. Impression:               - Patent end-to-side ileo-colonic anastomosis,                            characterized by healthy appearing mucosa.                           - Diverticulosis in the sigmoid colon.                           - External and internal hemorrhoids.                           - The examination was otherwise normal on direct                            and retroflexion views. NO FISSURE NOW - RESOLVED                           - No specimens collected. Recommendation:           - Patient has a contact number available for                            emergencies.  The signs and symptoms of potential                            delayed complications were discussed with the                            patient. Return to normal activities tomorrow.                            Written discharge instructions were provided to the                            patient.                           - High fiber diet.                           - Continue present medications.                           - No repeat colonoscopy due to age and the absence  of colonic polyps.                           - HC cream 1% pren                           looks like fissure has healed                           regular defecation, avoid constipation                           if further problems consider banding of hemorrhoids Iva Boop, MD 10/30/2020 2:04:49 PM This report has been signed electronically.

## 2020-10-30 NOTE — Progress Notes (Signed)
To PACU, VSS. Report to [email or web address] 

## 2020-10-30 NOTE — Patient Instructions (Addendum)
Nothing bad seen. Do have the hemorrhoids and some diverticulosis.  Key is to avoid constipation and the hemorrhoids should do better.  I prescribed some medicated cream for the hemorrhoids.  You had an anal fissure that seems to be gone.  If this does not help enough I can fix the hemorrhoids with a procedure called rubber banding.  I appreciate the opportunity to care for you. Iva Boop, MD, [facility]  [facility] for your hemorrhoids at your pharmacy. Thank you for letting us take care of your healthcare needs today Please see handouts given to you on Diverticulosis and hemorrhoids.  YOU HAD AN ENDOSCOPIC PROCEDURE TODAY AT THE [facility]:   Refer to the procedure report that was given to you for any specific questions about what was found during the examination.  If the procedure report does not answer your questions, please call your gastroenterologist to clarify.  If you requested that your care partner not be given the details of your procedure findings, then the procedure report has been included in a sealed envelope for you to review at your convenience later.  YOU SHOULD EXPECT: Some feelings of bloating in the abdomen. Passage of more gas than usual.  Walking can help get rid of the air that was put into your GI tract during the procedure and reduce the bloating. If you had a lower endoscopy (such as a colonoscopy or flexible sigmoidoscopy) you may notice spotting of blood in your stool or on the toilet paper. If you underwent a bowel prep for your procedure, you may not have a normal bowel movement for a few days.  Please Note:  You might notice some irritation and congestion in your nose or some drainage.  This is from the oxygen used during your procedure.  There is no need for concern and it should clear up in a day or so.  SYMPTOMS TO REPORT IMMEDIATELY:  Following lower endoscopy (colonoscopy or flexible sigmoidoscopy):  Excessive amounts of blood in the  stool  Significant tenderness or worsening of abdominal pains  Swelling of the abdomen that is new, acute  Fever of 100F or higher    For urgent or emergent issues, a gastroenterologist can be reached at any hour by calling [phone]. Do not use MyChart messaging for urgent concerns.    DIET:  We do recommend a small meal at first, but then you may proceed to your regular diet.  Drink plenty of fluids but you should avoid alcoholic beverages for 24 hours.  ACTIVITY:  You should plan to take it easy for the rest of today and you should NOT DRIVE or use heavy machinery until tomorrow (because of the sedation medicines used during the test).    FOLLOW UP: Our staff will call the number listed on your records 48-72 hours following your procedure to check on you and address any questions or concerns that you may have regarding the information given to you following your procedure. If we do not reach you, we will leave a message.  We will attempt to reach you two times.  During this call, we will ask if you have developed any symptoms of COVID 19. If you develop any symptoms (ie: fever, flu-like symptoms, shortness of breath, cough etc.) before then, please call [phone].  If you test positive for Covid 19 in the 2 weeks post procedure, please call and report this information to [place].    If any biopsies were taken you will be contacted  by phone or by letter within the next 1-3 weeks.  Please call us at [phone] if you have not heard about the biopsies in 3 weeks.    SIGNATURES/CONFIDENTIALITY: You and/or your care partner have signed paperwork which will be entered into your electronic medical record.  These signatures attest to the fact that that the information above on your After Visit Summary has been reviewed and is understood.  Full responsibility of the confidentiality of this discharge information lies with you and/or your care-partner.

## 2020-10-30 NOTE — Progress Notes (Signed)
Pt is asymptomatic with CBG of 75 drinking cran grape juice.

## 2020-10-30 NOTE — Progress Notes (Signed)
Pt's states no medical or surgical changes since previsit or office visit. 

## 2020-11-03 ENCOUNTER — Telehealth: Payer: Self-pay | Admitting: *Deleted

## 2020-11-03 NOTE — Telephone Encounter (Signed)
  Follow up Call-  Call back number 10/30/2020  Post procedure Call Back phone  # [phone]  Permission to leave phone message Yes  Some recent data might be hidden     Patient questions:  Do you have a fever, pain , or abdominal swelling? No. Pain Score  0 *  Have you tolerated food without any problems? Yes.    Have you been able to return to your normal activities? Yes.    Do you have any questions about your discharge instructions: Diet   No. Medications  No. Follow up visit  No.  Do you have questions or concerns about your Care? No.  Actions: * If pain score is 4 or above: No action needed, pain <4.

## 2023-01-27 ENCOUNTER — Ambulatory Visit: Payer: Medicare HMO | Admitting: Physician Assistant

## 2023-05-19 ENCOUNTER — Telehealth: Payer: Self-pay | Admitting: Physician Assistant

## 2023-05-19 NOTE — Telephone Encounter (Signed)
Patient was advised that he is scheduled for an office visit for 2/21 not a colonoscopy.

## 2023-05-19 NOTE — Telephone Encounter (Signed)
Beth, I am sending this to you because unfortunately all the calls are getting routed to me until the office opens Patient just called and says that he has colonoscopy scheduled for tomorrow at 10 AM and does not have a prep called in  He uses [facility] on [address]  You can make sure he gets a prep called in and call him and let him know much appreciated thank you

## 2023-05-20 ENCOUNTER — Ambulatory Visit: Payer: Medicare HMO | Admitting: Internal Medicine

## 2023-05-20 ENCOUNTER — Other Ambulatory Visit (INDEPENDENT_AMBULATORY_CARE_PROVIDER_SITE_OTHER): Payer: Medicare HMO

## 2023-05-20 ENCOUNTER — Encounter: Payer: Self-pay | Admitting: Internal Medicine

## 2023-05-20 VITALS — BP 124/68 | HR 54 | Ht 72.0 in | Wt 209.8 lb

## 2023-05-20 DIAGNOSIS — K5909 Other constipation: Secondary | ICD-10-CM | POA: Diagnosis not present

## 2023-05-20 DIAGNOSIS — K625 Hemorrhage of anus and rectum: Secondary | ICD-10-CM | POA: Diagnosis not present

## 2023-05-20 DIAGNOSIS — D509 Iron deficiency anemia, unspecified: Secondary | ICD-10-CM

## 2023-05-20 LAB — CBC WITH DIFFERENTIAL/PLATELET
Basophils Absolute: 0.1 10*3/uL (ref 0.0–0.1)
Basophils Relative: 1.2 % (ref 0.0–3.0)
Eosinophils Absolute: 0.2 10*3/uL (ref 0.0–0.7)
Eosinophils Relative: 2.8 % (ref 0.0–5.0)
HCT: 38.6 % — ABNORMAL LOW (ref 39.0–52.0)
Hemoglobin: 12 g/dL — ABNORMAL LOW (ref 13.0–17.0)
Lymphocytes Relative: 28.3 % (ref 12.0–46.0)
Lymphs Abs: 1.5 10*3/uL (ref 0.7–4.0)
MCHC: 31 g/dL (ref 30.0–36.0)
MCV: 82.9 fL (ref 78.0–100.0)
Monocytes Absolute: 0.7 10*3/uL (ref 0.1–1.0)
Monocytes Relative: 12.1 % — ABNORMAL HIGH (ref 3.0–12.0)
Neutro Abs: 3 10*3/uL (ref 1.4–7.7)
Neutrophils Relative %: 55.6 % (ref 43.0–77.0)
Platelets: 183 10*3/uL (ref 150.0–400.0)
RBC: 4.66 Mil/uL (ref 4.22–5.81)
RDW: 16.6 % — ABNORMAL HIGH (ref 11.5–15.5)
WBC: 5.5 10*3/uL (ref 4.0–10.5)

## 2023-05-20 LAB — IBC + FERRITIN
Ferritin: 23.1 ng/mL (ref 22.0–322.0)
Iron: 40 ug/dL — ABNORMAL LOW (ref 42–165)
Saturation Ratios: 11.6 % — ABNORMAL LOW (ref 20.0–50.0)
TIBC: 344.4 ug/dL (ref 250.0–450.0)
Transferrin: 246 mg/dL (ref 212.0–360.0)

## 2023-05-20 MED ORDER — SUFLAVE 178.7 G PO SOLR: 1.0000 | Freq: Once | ORAL | 0 refills | Status: AC

## 2023-05-20 MED ORDER — SUFLAVE 178.7 G PO SOLR
1.0000 | Freq: Once | ORAL | 0 refills | Status: DC
Start: 2023-05-20 — End: 2023-05-20

## 2023-05-20 NOTE — Progress Notes (Signed)
Cameron Harrison 77 y.o. 1945-10-13 [id number]  Assessment & Plan:   Encounter Diagnoses  Name Primary?   Iron deficiency anemia, unspecified iron deficiency anemia type Yes   Rectal bleeding    Chronic constipation    Schedule EGD and colonoscopy to evaluate iron deficiency anemia and look for possible gastrointestinal blood loss causes.  I do suspect the rectal bleeding is minor and unrelated as he has a known history of fissure and/or hemorrhoidal bleeding.  Seems similar.  I did discuss starting with an EGD since the colonoscopy is relatively recent but it was his preference to schedule both procedures and have them done, rather than separating them out.  I think given his age and sedation risks etc. that is a reasonable approach i.e. to do both procedures in 1 day.  Repeat labs today as well.  These show hemoglobin is improving on iron therapy.  Ferritin is low normal now.  Lab Results  Component Value Date   WBC 5.5 05/20/2023   HGB 12.0 (L) 05/20/2023   HCT 38.6 (L) 05/20/2023   MCV 82.9 05/20/2023   PLT 183.0 05/20/2023   Lab Results  Component Value Date   IRON 40 (L) 05/20/2023   TIBC 344.4 05/20/2023   FERRITIN 23.1 05/20/2023   CC: Tracey Harries, MD      Subjective:   Chief Complaint: Iron deficiency anemia  HPI 78 year old white man referred by Dr. Everlene Other because of iron deficiency anemia.  He is known to me with a history of colonoscopy for rectal bleeding in August 2022, and it revealed external and internal hemorrhoids, diverticulosis and evidence of prior right colon resection with ileocolonic anastomosis that was healthy.  A previously diagnosed anal fissure had resolved.  He saw Dr. Everlene Other in November 2020 for and was referred, apparently had been anemic and iron deficient previously but had not followed through with the referral, hemoglobin 11.4 MCV 72 white count 5 platelets 229.  Iron saturation was 10% with a low iron and a TIBC in the mid range  of 332 and a UIBC of 300 which was upper range of normal.  In August his ferritin had been 8 and hemoglobin 7.2.  He was having significant ice pica.  He sees slight blood when he wipes.  He has some chronic constipation which sounds like it might be a little bit worse since he started iron supplementation.  He denies melena or frank hematochezia.  Moves his bowels at least once a week though usually twice a week.  This does not seem to bother him.  GI review of systems is otherwise negative without dysphagia nausea vomiting or uncontrolled heartburn reflux symptoms. Colon 8/22  Allergies  Allergen Reactions   Oxycodone Itching   Current Meds  Medication Sig   amLODipine (NORVASC) 10 MG tablet Take 10 mg by mouth daily.   aspirin EC 81 MG tablet Take 81 mg by mouth daily.   ferrous sulfate 325 (65 FE) MG tablet Take 325 mg by mouth daily.   Hydrocortisone, Perianal, (PROCTO-PAK) 1 % CREA Apply 1 application topically at bedtime as needed.   Lidocaine-Glycerin (PREPARATION H EX) Apply 1 application topically 3 (three) times daily.   lisinopril (PRINIVIL,ZESTRIL) 5 MG tablet Take 5 mg by mouth daily.   metFORMIN (GLUCOPHAGE) 500 MG tablet Take 500 mg by mouth daily with breakfast.   pravastatin (PRAVACHOL) 20 MG tablet Take 20 mg by mouth daily.   [DISCONTINUED] SUFLAVE 178.7 g SOLR Take 1 kit by mouth once  for 1 dose.   Past Medical History:  Diagnosis Date   Anemia    Diabetes mellitus without complication (HCC)    Galeazzi's fracture of left radius    Hyperlipidemia    Hypertension    Past Surgical History:  Procedure Laterality Date   BACK SURGERY     ruptured disk   CHOLECYSTECTOMY N/A 03/25/2014   Procedure: LAPAROSCOPIC CHOLECYSTECTOMY WITH INTRAOPERATIVE CHOLANGIOGRAM;  Surgeon: Violeta Gelinas, MD;  Location: [facility] OR;  Service: General;  Laterality: N/A;   HEMICOLECTOMY     LYSIS OF ADHESION  03/25/2014   Procedure: Laparascopic LYSIS OF ADHESIONs ;  Surgeon: Violeta Gelinas,  MD;  Location: [facility] OR;  Service: General;;   OPEN REDUCTION INTERNAL FIXATION (ORIF) DISTAL RADIAL FRACTURE Left 09/24/2015   Procedure: OPEN REDUCTION INTERNAL FIXATION (ORIF) LEFT RADIAL SHAFT FRACTURE;  Surgeon: Tarry Kos, MD;  Location: [facility];  Service: Orthopedics;  Laterality: Left;   PERCUTANEOUS PINNING Left 09/24/2015   Procedure: CLOSED REDUCTION PERCUTANEOUS PINNING LEFT DISTAL RADIOULNAR JOINT;  Surgeon: Tarry Kos, MD;  Location: [facility];  Service: Orthopedics;  Laterality: Left;   Social History   Social History Narrative   Patient is married he is a former smoker no alcohol tobacco or drug use reported     Review of Systems As per HPI otherwise negative  Objective:   Physical Exam @BP  124/68   Pulse (!) 54   Ht 6' (1.829 m)   Wt 209 lb 12.8 oz (95.2 kg)   SpO2 96%   BMI 28.45 kg/m @  General:  Well-developed, well-nourished and in no acute distress Eyes:  anicteric. ENT:   Mouth and posterior pharynx free of lesions.  Neck:   supple w/o thyromegaly or mass.  Lungs: Clear to auscultation bilaterally. Heart:   S1S2, no rubs, murmurs, gallops. Abdomen:  soft, non-tender, no hepatosplenomegaly, hernia, or mass and BS+.  Rectal: deferred Lymph:  no cervical or supraclavicular adenopathy. Neuro:  A&O x 3.  Psych:  appropriate mood and  Affect.   Data Reviewed: See HPI

## 2023-05-20 NOTE — Patient Instructions (Signed)
You have been scheduled for an endoscopy and colonoscopy. Please follow the written instructions given to you at your visit today.  If you use inhalers (even only as needed), please bring them with you on the day of your procedure.  DO NOT TAKE 7 DAYS PRIOR TO TEST- Trulicity (dulaglutide) Ozempic, Wegovy (semaglutide) Mounjaro (tirzepatide) Bydureon Bcise (exanatide extended release)  DO NOT TAKE 1 DAY PRIOR TO YOUR TEST Rybelsus (semaglutide) Adlyxin (lixisenatide) Victoza (liraglutide) Byetta (exanatide) ___________________________________________________________________________  Bonita Quin will receive your bowel preparation through [facility], which ensures the lowest copay and home delivery, with outreach via text or call from an [phone] number. Please respond promptly to avoid rescheduling of your procedure. If you are interested in alternative options or have any questions regarding your prep, please contact them at [phone] ____________________________________________________________________________  Your Provider Has Sent Your Bowel Prep Regimen To [facility]   [facility] will contact you to verify your information and collect your copay, if applicable. Enjoy the comfort of your home while your prescription is mailed to you, FREE of any shipping charges.   [facility] accepts all major insurance benefits and applies discounts & coupons.  Have additional questions?   Chat: [email or web address] Call: [phone] Email: [email or web address] NCPDP: [id number]  How will [facility] contact you?  With a Welcome phone call,  a Welcome text and a checkout link in text form.  Texts you receive from [phone] Are NOT Spam.  *To set up delivery, you must complete the checkout process via link or speak to one of the patient care representatives. If [facility] is unable to reach you, your prescription may be delayed.  To avoid long hold times on the phone, you may also  utilize the secure chat feature on the [facility] website to request that they call you back for transaction completion or to expedite your concerns.  Your provider has requested that you go to the basement level for lab work before leaving today. Press "B" on the elevator. The lab is located at the first door on the left as you exit the elevator.  Due to recent changes in healthcare laws, you may see the results of your imaging and laboratory studies on MyChart before your provider has had a chance to review them.  We understand that in some cases there may be results that are confusing or concerning to you. Not all laboratory results come back in the same time frame and the provider may be waiting for multiple results in order to interpret others.  Please give [place] 48 hours in order for your provider to thoroughly review all the results before contacting the office for clarification of your results.   _______________________________________________________  If your blood pressure at your visit was 140/90 or greater, please contact your primary care physician to follow up on this.  _______________________________________________________  If you are age 68 or older, your body mass index should be between 23-30. Your Body mass index is 28.45 kg/m. If this is out of the aforementioned range listed, please consider follow up with your Primary Care Provider.  If you are age 11 or younger, your body mass index should be between 19-25. Your Body mass index is 28.45 kg/m. If this is out of the aformentioned range listed, please consider follow up with your Primary Care Provider.   ________________________________________________________  The [facility] providers would like to encourage you to use [facility] to communicate with providers for non-urgent requests or questions.  Due to long hold times on the telephone, sending your provider  a message by [facility] may be a faster and more efficient way to get a  response.  Please allow 48 business hours for a response.  Please remember that this is for non-urgent requests.  _______________________________________________________  Thank you for choosing me and [facility].  Dr.Carl Leone Payor

## 2023-07-05 NOTE — Progress Notes (Unsigned)
  Gastroenterology History and Physical   Primary Care Physician:  Tracey Harries, MD   Reason for Procedure:   Iron deficiency anemia  Plan:    EGD and colonoscopy     HPI: Cameron Harrison is a 78 y.o. male referred by Dr. Everlene Other because of iron deficiency anemia.  He is known to me with a history of colonoscopy for rectal bleeding in August 2022, and it revealed external and internal hemorrhoids, diverticulosis and evidence of prior right colon resection with ileocolonic anastomosis that was healthy.  A previously diagnosed anal fissure had resolved.   He saw Dr. Everlene Other in November 2024 for and was referred, apparently had been anemic and iron deficient previously but had not followed through with the referral, hemoglobin 11.4 MCV 72 white count 5 platelets 229.  Iron saturation was 10% with a low iron and a TIBC in the mid range of 332 and a UIBC of 300 which was upper range of normal.  In August his ferritin had been 8 and hemoglobin 7.2.  He was having significant ice pica.   He sees slight blood when he wipes.  He has some chronic constipation which sounds like it might be a little bit worse since he started iron supplementation.  He denies melena or frank hematochezia.  Moves his bowels at least once a week though usually twice a week.  This does not seem to bother him.  GI review of systems is otherwise negative without dysphagia nausea vomiting or uncontrolled heartburn reflux symptoms.    Past Medical History:  Diagnosis Date   Anemia    Diabetes mellitus without complication (HCC)    Galeazzi's fracture of left radius    Hyperlipidemia    Hypertension     Past Surgical History:  Procedure Laterality Date   BACK SURGERY     ruptured disk   CHOLECYSTECTOMY N/A 03/25/2014   Procedure: LAPAROSCOPIC CHOLECYSTECTOMY WITH INTRAOPERATIVE CHOLANGIOGRAM;  Surgeon: Violeta Gelinas, MD;  Location: [facility] OR;  Service: General;  Laterality: N/A;   HEMICOLECTOMY     LYSIS OF  ADHESION  03/25/2014   Procedure: Laparascopic LYSIS OF ADHESIONs ;  Surgeon: Violeta Gelinas, MD;  Location: [facility] OR;  Service: General;;   OPEN REDUCTION INTERNAL FIXATION (ORIF) DISTAL RADIAL FRACTURE Left 09/24/2015   Procedure: OPEN REDUCTION INTERNAL FIXATION (ORIF) LEFT RADIAL SHAFT FRACTURE;  Surgeon: Tarry Kos, MD;  Location: [facility];  Service: Orthopedics;  Laterality: Left;   PERCUTANEOUS PINNING Left 09/24/2015   Procedure: CLOSED REDUCTION PERCUTANEOUS PINNING LEFT DISTAL RADIOULNAR JOINT;  Surgeon: Tarry Kos, MD;  Location: [facility];  Service: Orthopedics;  Laterality: Left;    Prior to Admission medications   Medication Sig Start Date End Date Taking? Authorizing Provider  amLODipine (NORVASC) 10 MG tablet Take 10 mg by mouth daily.    [provider]  aspirin EC 81 MG tablet Take 81 mg by mouth daily.    [provider]  ferrous sulfate 325 (65 FE) MG tablet Take 325 mg by mouth daily.    [provider]  Hydrocortisone, Perianal, (PROCTO-PAK) 1 % CREA Apply 1 application topically at bedtime as needed. 10/30/20   Iva Boop, MD  Lidocaine-Glycerin (PREPARATION H EX) Apply 1 application topically 3 (three) times daily.    [provider]  lisinopril (PRINIVIL,ZESTRIL) 5 MG tablet Take 5 mg by mouth daily.    [provider]  metFORMIN (GLUCOPHAGE) 500 MG tablet Take 500 mg by mouth  daily with breakfast.    [provider]  pravastatin (PRAVACHOL) 20 MG tablet Take 20 mg by mouth daily.    [provider]    Current Outpatient Medications  Medication Sig Dispense Refill   amLODipine (NORVASC) 10 MG tablet Take 10 mg by mouth daily.     aspirin EC 81 MG tablet Take 81 mg by mouth daily.     lisinopril (PRINIVIL,ZESTRIL) 5 MG tablet Take 5 mg by mouth daily.     metFORMIN (GLUCOPHAGE) 500 MG tablet Take 500 mg by mouth daily with breakfast.     pravastatin (PRAVACHOL) 20 MG  tablet Take 20 mg by mouth daily.     ferrous sulfate 325 (65 FE) MG tablet Take 325 mg by mouth daily.     Hydrocortisone, Perianal, (PROCTO-PAK) 1 % CREA Apply 1 application topically at bedtime as needed. 28 g 1   Lidocaine-Glycerin (PREPARATION H EX) Apply 1 application topically 3 (three) times daily.     Current Facility-Administered Medications  Medication Dose Route Frequency Provider Last Rate Last Admin   0.9 %  sodium chloride infusion  500 mL Intravenous Continuous Iva Boop, MD        Allergies as of 07/06/2023 - Review Complete 07/06/2023  Allergen Reaction Noted   Oxycodone Itching 09/04/2015    Family History  Problem Relation Age of Onset   Colon cancer Neg Hx    Stomach cancer Neg Hx    Liver disease Neg Hx    Esophageal cancer Neg Hx    Pancreatic cancer Neg Hx     Social History   Socioeconomic History   Marital status: Married    Spouse name: Not on file   Number of children: Not on file   Years of education: Not on file   Highest education level: Not on file  Occupational History   Not on file  Tobacco Use   Smoking status: Former    Passive exposure: Never   Smokeless tobacco: Never   Tobacco comments:    quit smoking in 1989  Vaping Use   Vaping status: Not on file  Substance and Sexual Activity   Alcohol use: No   Drug use: No   Sexual activity: Not Currently  Other Topics Concern   Not on file  Social History Narrative   Patient is married he is a former smoker no alcohol tobacco or drug use reported   Social Drivers of Corporate investment banker Strain: Low Risk  (09/15/2022)   Received from [facility]   Overall Financial Resource Strain (CARDIA)    Difficulty of Paying Living Expenses: Not hard at all  Food Insecurity: No Food Insecurity (09/15/2022)   Received from [facility]   Hunger Vital Sign    Worried About Running Out of Food in the Last Year: Never true    Ran Out of Food in the Last Year: Never true   Transportation Needs: No Transportation Needs (09/15/2022)   Received from [facility] - Transportation    Lack of Transportation (Medical): No    Lack of Transportation (Non-Medical): No  Physical Activity: Sufficiently Active (09/15/2022)   Received from [facility]   Exercise Vital Sign    Days of Exercise per Week: 3 days    Minutes of Exercise per Session: 60 min  Stress: No Stress Concern Present (09/15/2022)   Received from [facility] - Occupational Stress Questionnaire  Feeling of Stress : Not at all  Social Connections: Socially Integrated (09/15/2022)   Received from [facility]   Social Network    How would you rate your social network (family, work, friends)?: Good participation with social networks  Intimate Partner Violence: Not At Risk (09/15/2022)   Received from [facility]   HITS    Over the last 12 months how often did your partner physically hurt you?: Never    Over the last 12 months how often did your partner insult you or talk down to you?: Never    Over the last 12 months how often did your partner threaten you with physical harm?: Never    Over the last 12 months how often did your partner scream or curse at you?: Never    Review of Systems:  All other review of systems negative except as mentioned in the HPI.  Physical Exam: Vital signs BP 117/80   Pulse 67   Temp 98.4 F (36.9 C)   Ht 6' (1.829 m)   Wt 209 lb (94.8 kg)   SpO2 98%   BMI 28.35 kg/m   General:   Alert,  Well-developed, well-nourished, pleasant and cooperative in NAD Lungs:  Clear throughout to auscultation.   Heart:  Regular rate and rhythm; no murmurs, clicks, rubs,  or gallops. Abdomen:  Soft, nontender and nondistended. Normal bowel sounds.   Neuro/Psych:  Alert and cooperative. Normal mood and affect. A and O x 3   @Owen Pagnotta  Sena Slate, MD, [facility] Gastroenterology [phone] (pager) 07/06/2023 10:20 AM@

## 2023-07-06 ENCOUNTER — Ambulatory Visit (AMBULATORY_SURGERY_CENTER): Payer: Medicare HMO | Admitting: Internal Medicine

## 2023-07-06 ENCOUNTER — Other Ambulatory Visit (INDEPENDENT_AMBULATORY_CARE_PROVIDER_SITE_OTHER)

## 2023-07-06 ENCOUNTER — Encounter: Payer: Self-pay | Admitting: Internal Medicine

## 2023-07-06 VITALS — BP 117/90 | HR 64 | Temp 98.4°F | Resp 12 | Ht 72.0 in | Wt 209.0 lb

## 2023-07-06 DIAGNOSIS — B9681 Helicobacter pylori [H. pylori] as the cause of diseases classified elsewhere: Secondary | ICD-10-CM

## 2023-07-06 DIAGNOSIS — K6389 Other specified diseases of intestine: Secondary | ICD-10-CM

## 2023-07-06 DIAGNOSIS — K5730 Diverticulosis of large intestine without perforation or abscess without bleeding: Secondary | ICD-10-CM

## 2023-07-06 DIAGNOSIS — K6289 Other specified diseases of anus and rectum: Secondary | ICD-10-CM | POA: Diagnosis not present

## 2023-07-06 DIAGNOSIS — K2950 Unspecified chronic gastritis without bleeding: Secondary | ICD-10-CM

## 2023-07-06 DIAGNOSIS — D509 Iron deficiency anemia, unspecified: Secondary | ICD-10-CM

## 2023-07-06 DIAGNOSIS — Z980 Intestinal bypass and anastomosis status: Secondary | ICD-10-CM

## 2023-07-06 DIAGNOSIS — K644 Residual hemorrhoidal skin tags: Secondary | ICD-10-CM

## 2023-07-06 DIAGNOSIS — K648 Other hemorrhoids: Secondary | ICD-10-CM

## 2023-07-06 DIAGNOSIS — K31819 Angiodysplasia of stomach and duodenum without bleeding: Secondary | ICD-10-CM | POA: Diagnosis not present

## 2023-07-06 DIAGNOSIS — K635 Polyp of colon: Secondary | ICD-10-CM

## 2023-07-06 DIAGNOSIS — K633 Ulcer of intestine: Secondary | ICD-10-CM

## 2023-07-06 DIAGNOSIS — K3189 Other diseases of stomach and duodenum: Secondary | ICD-10-CM | POA: Diagnosis not present

## 2023-07-06 DIAGNOSIS — Z860100 Personal history of colon polyps, unspecified: Secondary | ICD-10-CM

## 2023-07-06 LAB — CBC
HCT: 38.1 % — ABNORMAL LOW (ref 39.0–52.0)
Hemoglobin: 11.9 g/dL — ABNORMAL LOW (ref 13.0–17.0)
MCHC: 31.3 g/dL (ref 30.0–36.0)
MCV: 82.2 fl (ref 78.0–100.0)
Platelets: 270 10*3/uL (ref 150.0–400.0)
RBC: 4.63 Mil/uL (ref 4.22–5.81)
RDW: 15.6 % — ABNORMAL HIGH (ref 11.5–15.5)
WBC: 4.5 10*3/uL (ref 4.0–10.5)

## 2023-07-06 LAB — FERRITIN: Ferritin: 18.7 ng/mL — ABNORMAL LOW (ref 22.0–322.0)

## 2023-07-06 MED ORDER — SODIUM CHLORIDE 0.9 % IV SOLN
500.0000 mL | INTRAVENOUS | Status: AC
Start: 2023-07-06 — End: 2023-07-07

## 2023-07-06 NOTE — Progress Notes (Signed)
 Called to room to assist during endoscopic procedure.  Patient ID and intended procedure confirmed with present staff. Received instructions for my participation in the procedure from the performing physician.

## 2023-07-06 NOTE — Progress Notes (Signed)
 Sedate, gd SR, tolerated procedure well, VSS, report to RN

## 2023-07-06 NOTE — Patient Instructions (Addendum)
 The upper endoscopy exam revealed possible gastritis (inflamed stomach) which I biopsied. 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 It can be detroyed with tools available at hospital - not sure it is necessary.  The colonoscopy revealed 3 polyps in the area where you had surgery years ago. I removed them though the larger one is not certain to be completely removed.  I need to see what they are and figure out next steps. Will contact you.  Hemorrhoids seen again.  I appreciate the opportunity to care for you. Iva Boop, MD, FACG   YOU HAD AN ENDOSCOPIC PROCEDURE TODAY AT THE [facility]:   Refer to the procedure report that was given to you for any specific questions about what was found during the examination.  If the procedure report does not answer your questions, please call your gastroenterologist to clarify.  If you requested that your care partner not be given the details of your procedure findings, then the procedure report has been included in a sealed envelope for you to review at your convenience later.  YOU SHOULD EXPECT: Some feelings of bloating in the abdomen. Passage of more gas than usual.  Walking can help get rid of the air that was put into your GI tract during the procedure and reduce the bloating. If you had a lower endoscopy (such as a colonoscopy or flexible sigmoidoscopy) you may notice spotting of blood in your stool or on the toilet paper. If you underwent a bowel prep for your procedure, you may not have a normal bowel movement for a few days.  Please Note:  You might notice some irritation and congestion in your nose or some drainage.  This is from the oxygen used during your procedure.  There is no need for concern and it should clear up in a day or so.  SYMPTOMS TO REPORT IMMEDIATELY:  Following lower endoscopy (colonoscopy or flexible sigmoidoscopy):  Excessive amounts of blood in the stool  Significant  tenderness or worsening of abdominal pains  Swelling of the abdomen that is new, acute  Fever of 100F or higher  Following upper endoscopy (EGD)  Vomiting of blood or coffee ground material  New chest pain or pain under the shoulder blades  Painful or persistently difficult swallowing  New shortness of breath  Fever of 100F or higher  Black, tarry-looking stools  For urgent or emergent issues, a gastroenterologist can be reached at any hour by calling [phone]. Do not use MyChart messaging for urgent concerns.    DIET:  We do recommend a small meal at first, but then you may proceed to your regular diet.  Drink plenty of fluids but you should avoid alcoholic beverages for 24 hours.  MEDICATIONS: Continue present medications. NO Aspirin, Ibuprofen, Naproxen, or other Non-steroidal Anti-inflammatory drugs for 2 WEEKS after polyp removal.  FOLLOW UP: Await pathology results. Repeat colonoscopy may be recommended. The colonoscopy date will be determined after pathology results from today's exam become available for review. We will bring you to lab today prior to discharge for a blood draw for CBC and Ferritin.  Please see handouts given to you by your recovery nurse: Polyps, Hemorrhoids, Diverticulosis.  Thank you for allowing [place] to provide for your healthcare needs today.  ACTIVITY:  You should plan to take it easy for the rest of today and you should NOT DRIVE or use heavy machinery until tomorrow (because of the sedation medicines used  during the test).    FOLLOW UP: Our staff will call the number listed on your records the next business day following your procedure.  We will call around 7:15- 8:00 am to check on you and address any questions or concerns that you may have regarding the information given to you following your procedure. If we do not reach you, we will leave a message.     If any biopsies were taken you will be contacted by phone or by letter within the next 1-3  weeks.  Please call us at [phone] if you have not heard about the biopsies in 3 weeks.    SIGNATURES/CONFIDENTIALITY: You and/or your care partner have signed paperwork which will be entered into your electronic medical record.  These signatures attest to the fact that that the information above on your After Visit Summary has been reviewed and is understood.  Full responsibility of the confidentiality of this discharge information lies with you and/or your care-partner.

## 2023-07-06 NOTE — Op Note (Signed)
 [facility] Patient Name: Cameron Harrison Procedure Date: 07/06/2023 10:16 AM MRN: [id number] Endoscopist: Iva Boop , MD, [id number] Age: 78 Referring MD:  Date of Birth: 06-Apr-1945 Gender: Male Account #: [id number] Procedure:                Colonoscopy Indications:              Last colonoscopy: August 2022, Iron deficiency                            anemia Medicines:                Monitored Anesthesia Care Procedure:                Pre-Anesthesia Assessment:                           - Prior to the procedure, a History and Physical                            was performed, and patient medications and                            allergies were reviewed. The patient's tolerance of                            previous anesthesia was also reviewed. The risks                            and benefits of the procedure and the sedation                            options and risks were discussed with the patient.                            All questions were answered, and informed consent                            was obtained. Prior Anticoagulants: The patient has                            taken no anticoagulant or antiplatelet agents. ASA                            Grade Assessment: II - A patient with mild systemic                            disease. After reviewing the risks and benefits,                            the patient was deemed in satisfactory condition to                            undergo the procedure.  After obtaining informed consent, the colonoscope                            was passed under direct vision. Throughout the                            procedure, the patient's blood pressure, pulse, and                            oxygen saturations were monitored continuously. The                            Olympus Scope [id number] was introduced through the                            anus and advanced to the the ileocolonic                             anastomosis. The colonoscopy was somewhat difficult                            due to post-surgical anatomy. The patient tolerated                            the procedure well. The quality of the bowel                            preparation was adequate. The ileocecal valve,                            appendiceal orifice, and rectum were photographed.                            The bowel preparation used was SUFLAVE via split                            dose instruction. Scope In: 10:39:32 AM Scope Out: 11:19:14 AM Scope Withdrawal Time: 0 hours 35 minutes 46 seconds  Total Procedure Duration: 0 hours 39 minutes 42 seconds  Findings:                 The digital rectal exam findings include decreased                            sphincter tone.                           There was evidence of a prior end-to-side                            ileo-colonic anastomosis in the transverse colon.                            This was patent. The anastomosis was traversed.  A 15 mm polyp was found in the anastomosis. The                            polyp was sessile. The polyp was removed with a                            piecemeal technique using a hot snare. Polyp                            resection was incomplete. The resected tissue was                            retrieved. Verification of patient identification                            for the specimen was done. Estimated blood loss:                            none.                           Two sessile polyps were found in the anastomosis.                            The polyps were 4 mm in size. These polyps were                            removed with a cold snare. Resection and retrieval                            were complete. Verification of patient                            identification for the specimen was done. Estimated                            blood loss was minimal.                            External and internal hemorrhoids were found. The                            hemorrhoids were large.                           Multiple small-mouthed diverticula were found in                            the sigmoid colon.                           The exam was otherwise without abnormality on                            direct and  retroflexion views. Complications:            No immediate complications. Estimated Blood Loss:     Estimated blood loss was minimal. Impression:               - Decreased sphincter tone found on digital rectal                            exam.                           - Patent end-to-side ileo-colonic anastomosis.                           - One 15 mm polyp at the anastomosis, removed                            piecemeal using a hot snare. Incomplete resection.                            Resected tissue retrieved. very difficult angle -                            lateral to scope view mostly and was able to snare                            off most with 2 snarings but angle prevented going                            further. If inflammatory may not need to remove                            further. 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 Resected and retrieved. I suspect                            these are staple vs. suture granulomas after                            removal.                           - External and internal hemorrhoids. Cause of                            rectal bleeding.                           -  Diverticulosis in the sigmoid colon.                           - The examination was otherwise normal on direct                            and retroflexion views. Recommendation:           - Patient has a contact number available for                            emergencies. The signs and  symptoms of potential                            delayed complications were discussed with the                            patient. Return to normal activities tomorrow.                            Written discharge instructions were provided to the                            patient.                           - Resume previous diet.                           - Continue present medications.                           - No aspirin, ibuprofen, naproxen, or other                            non-steroidal anti-inflammatory drugs for 2 weeks                            after polyp removal. need to consider dc of 81 mg                            aspirin if only preventive.                           - Await pathology results.                           - Repeat colonoscopy may be recommended. The                            colonoscopy date will be determined after pathology                            results from today's exam become available for  review.                           CBC and ferritin today. Iva Boop, MD 07/06/2023 11:46:58 AM This report has been signed electronically.

## 2023-07-06 NOTE — Op Note (Signed)
 [facility] Patient Name: Cameron Harrison Procedure Date: 07/06/2023 10:25 AM MRN: [id number] Endoscopist: Iva Boop , MD, [id number] Age: 78 Referring MD:  Date of Birth: [birth year] Gender: Male Account #: [id number] Procedure:                Upper GI endoscopy Indications:              Iron deficiency anemia Medicines:                Monitored Anesthesia Care Procedure:                Pre-Anesthesia Assessment:                           - Prior to the procedure, a History and Physical                            was performed, and patient medications and                            allergies were reviewed. The patient's tolerance of                            previous anesthesia was also reviewed. The risks                            and benefits of the procedure and the sedation                            options and risks were discussed with the patient.                            All questions were answered, and informed consent                            was obtained. Prior Anticoagulants: The patient has                            taken no anticoagulant or antiplatelet agents. ASA                            Grade Assessment: II - A patient with mild systemic                            disease. After reviewing the risks and benefits,                            the patient was deemed in satisfactory condition to                            undergo the procedure.                           After obtaining informed consent, the endoscope was  passed under direct vision. Throughout the                            procedure, the patient's blood pressure, pulse, and                            oxygen saturations were monitored continuously. The                            Olympus Scope [id number] was introduced through the                            mouth, and advanced to the second part of duodenum.                            The upper GI endoscopy  was accomplished without                            difficulty. The patient tolerated the procedure                            well. Scope In: Scope Out: Findings:                 A single 3 mm angiodysplastic lesion without                            bleeding was found in the second portion of the                            duodenum.                           Patchy mild mucosal changes characterized by                            discoloration, erythema and granularity were found                            in the gastric antrum. Biopsies were taken with a                            cold forceps for histology. Verification of patient                            identification for the specimen was done. Estimated                            blood loss was minimal.                           The exam was otherwise without abnormality.                           The cardia and gastric fundus were normal on  retroflexion. Complications:            No immediate complications. Estimated Blood Loss:     Estimated blood loss was minimal. Impression:               - A single non-bleeding angiodysplastic lesion in                            the duodenum.                           - Discolored, erythematous and granular mucosa in                            the antrum. Biopsied.                           - The examination was otherwise normal. Recommendation:           - Patient has a contact number available for                            emergencies. The signs and symptoms of potential                            delayed complications were discussed with the                            patient. Return to normal activities tomorrow.                            Written discharge instructions were provided to the                            patient.                           - Resume previous diet.                           - Continue present medications.                            - Await pathology results.                           - See the other procedure note for documentation of                            additional recommendations. I am inclined to                            discontinue 81 mg aspirin if just a preventive                            indication. Not sure need to ablate the AVM - will  depend upon response to po iron and colonoscopy                            results. Hgb was getting better. Will recheck today. Iva Boop, MD 07/06/2023 11:35:12 AM This report has been signed electronically.

## 2023-07-07 ENCOUNTER — Telehealth: Payer: Self-pay | Admitting: *Deleted

## 2023-07-07 NOTE — Telephone Encounter (Signed)
 Returned pts call.  Advised him that per Dr. Marvell Fuller procedure report, he should avoid the 81mg  Aspirin for the next 2 weeks.  Pt verbalized understanding.

## 2023-07-07 NOTE — Telephone Encounter (Signed)
 Inbound call from patient requesting f/u call to speak with a nurse in regards to him taking asprin. Please advise.   Thank you

## 2023-07-07 NOTE — Telephone Encounter (Signed)
  Follow up Call-     07/06/2023    9:27 AM 10/30/2020   12:42 PM  Call back number  Post procedure Call Back phone  # [phone]  Permission to leave phone message Yes Yes     Patient questions:  Do you have a fever, pain , or abdominal swelling? No. Pain Score  0 *  Have you tolerated food without any problems? Yes.    Have you been able to return to your normal activities? Yes.    Do you have any questions about your discharge instructions: Diet   No. Medications  No. Follow up visit  No.  Do you have questions or concerns about your Care? No.  Actions: * If pain score is 4 or above: No action needed, pain <4.

## 2023-07-12 ENCOUNTER — Telehealth: Payer: Self-pay | Admitting: Internal Medicine

## 2023-07-12 ENCOUNTER — Encounter: Payer: Self-pay | Admitting: Internal Medicine

## 2023-07-12 DIAGNOSIS — B9681 Helicobacter pylori [H. pylori] as the cause of diseases classified elsewhere: Secondary | ICD-10-CM

## 2023-07-12 HISTORY — DX: Helicobacter pylori (H. pylori) as the cause of diseases classified elsewhere: B96.81

## 2023-07-12 LAB — SURGICAL PATHOLOGY

## 2023-07-12 NOTE — Telephone Encounter (Signed)
 Please contact the patient from the office with pathology results.  #1 the polyps found at colonoscopy were inflammatory and related to where he was sewn back together and nothing further needs to be done with these.  #2 I found an infection called Helicobacter pylori causing inflammation and this needs to be treated.   1) Omeprazole 20 mg 2 times a day x 14 d 2) Pepto Bismol 2 tabs (262 mg each) 4 times a day x 14 d 3) Metronidazole 250 mg 4 times a day x 14 d 4) doxycycline 100 mg 2 times a day x 14 d  After 14 d stop omeprazole also  In 4 weeks after treatment completed do H. Pylori stool antigen - dx H. Pylori gastritis   #3 discontinue 81 mg aspirin if not done already  #4 continue iron pills and follow-up with PCP about iron levels and blood counts as planned or within 3 months  #5 route or fax this phone note to PCP when complete

## 2023-07-13 MED ORDER — METRONIDAZOLE 250 MG PO TABS
250.0000 mg | ORAL_TABLET | Freq: Four times a day (QID) | ORAL | 0 refills | Status: AC
Start: 2023-07-13 — End: 2023-07-27

## 2023-07-13 MED ORDER — BISMUTH SUBSALICYLATE 262 MG PO TABS: 2.0000 | ORAL_TABLET | Freq: Four times a day (QID) | ORAL | 0 refills | Status: AC

## 2023-07-13 MED ORDER — OMEPRAZOLE 20 MG PO CPDR: 20.0000 mg | DELAYED_RELEASE_CAPSULE | Freq: Two times a day (BID) | ORAL | 0 refills | Status: AC

## 2023-07-13 MED ORDER — DOXYCYCLINE HYCLATE 100 MG PO CAPS: 100.0000 mg | ORAL_CAPSULE | Freq: Two times a day (BID) | ORAL | 0 refills | Status: AC

## 2023-07-13 NOTE — Telephone Encounter (Signed)
 The pt has been advised of the results and treatment.  He agrees and will come in for H pylori testing in about a month.  He is aware to stop omeprazole after the 14 day treatment.  He has stopped ASA and will continue iron and follow up with PCP. All notes sent to PCP as well.  I will also mail a letter with this information to the pt and send to My Chart

## 2023-07-19 ENCOUNTER — Telehealth: Payer: Self-pay | Admitting: Internal Medicine

## 2023-07-19 NOTE — Telephone Encounter (Signed)
 Advised to separate the medications by 2 hours before or 4 hours afterwards.

## 2023-07-19 NOTE — Telephone Encounter (Signed)
 Patient called and stated that he is wanting to know if he take is iron medication along with the medication for his H-pylori will it cause a reaction chemically. Patient is requesting a call  back. Please advise.
